# Patient Record
Sex: Male | Born: 1970 | Race: White | Hispanic: No | Marital: Married | Smoking: Former smoker
Health system: Southern US, Community
[De-identification: ages and names within clinical notes are randomized; demographics above are authoritative.]

## PROBLEM LIST (undated history)

## (undated) DIAGNOSIS — H9042 Sensorineural hearing loss, unilateral, left ear, with unrestricted hearing on the contralateral side: Secondary | ICD-10-CM

## (undated) DIAGNOSIS — F909 Attention-deficit hyperactivity disorder, unspecified type: Secondary | ICD-10-CM

## (undated) DIAGNOSIS — J309 Allergic rhinitis, unspecified: Secondary | ICD-10-CM

## (undated) DIAGNOSIS — I10 Essential (primary) hypertension: Secondary | ICD-10-CM

## (undated) DIAGNOSIS — E781 Pure hyperglyceridemia: Secondary | ICD-10-CM

## (undated) DIAGNOSIS — E7800 Pure hypercholesterolemia, unspecified: Secondary | ICD-10-CM

## (undated) DIAGNOSIS — R7303 Prediabetes: Secondary | ICD-10-CM

## (undated) DIAGNOSIS — G4733 Obstructive sleep apnea (adult) (pediatric): Secondary | ICD-10-CM

## (undated) DIAGNOSIS — G4730 Sleep apnea, unspecified: Secondary | ICD-10-CM

## (undated) DIAGNOSIS — K589 Irritable bowel syndrome without diarrhea: Secondary | ICD-10-CM

## (undated) DIAGNOSIS — E785 Hyperlipidemia, unspecified: Secondary | ICD-10-CM

## (undated) DIAGNOSIS — E119 Type 2 diabetes mellitus without complications: Secondary | ICD-10-CM

## (undated) DIAGNOSIS — R7689 Other specified abnormal immunological findings in serum: Secondary | ICD-10-CM

## (undated) DIAGNOSIS — K219 Gastro-esophageal reflux disease without esophagitis: Secondary | ICD-10-CM

## (undated) DIAGNOSIS — E663 Overweight: Secondary | ICD-10-CM

## (undated) HISTORY — PX: COLONOSCOPY: SHX174

## (undated) HISTORY — DX: Obstructive sleep apnea (adult) (pediatric): G47.33

## (undated) HISTORY — DX: Essential (primary) hypertension: I10

## (undated) HISTORY — DX: Type 2 diabetes mellitus without complications: E11.9

## (undated) HISTORY — DX: Hyperlipidemia, unspecified: E78.5

## (undated) HISTORY — PX: TYMPANOSTOMY TUBE PLACEMENT: SHX32

## (undated) HISTORY — DX: Pure hypercholesterolemia, unspecified: E78.00

## (undated) HISTORY — DX: Pure hyperglyceridemia: E78.1

## (undated) HISTORY — DX: Allergic rhinitis, unspecified: J30.9

## (undated) HISTORY — DX: Overweight: E66.3

## (undated) HISTORY — DX: Gastro-esophageal reflux disease without esophagitis: K21.9

## (undated) HISTORY — DX: Sensorineural hearing loss, unilateral, left ear, with unrestricted hearing on the contralateral side: H90.42

## (undated) HISTORY — PX: OTHER SURGICAL HISTORY: SHX169

## (undated) HISTORY — DX: Sleep apnea, unspecified: G47.30

## (undated) HISTORY — DX: Other specified abnormal immunological findings in serum: R76.89

## (undated) HISTORY — DX: Irritable bowel syndrome, unspecified: K58.9

## (undated) HISTORY — DX: Prediabetes: R73.03

## (undated) HISTORY — DX: Attention-deficit hyperactivity disorder, unspecified type: F90.9

---

## 2009-10-11 ENCOUNTER — Ambulatory Visit: Payer: Self-pay | Admitting: Cardiovascular Disease

## 2009-10-11 ENCOUNTER — Emergency Department (HOSPITAL_COMMUNITY): Admission: EM | Admit: 2009-10-11 | Discharge: 2009-10-11 | Payer: Self-pay | Admitting: Family Medicine

## 2009-10-11 ENCOUNTER — Observation Stay (HOSPITAL_COMMUNITY): Admission: EM | Admit: 2009-10-11 | Discharge: 2009-10-12 | Payer: Self-pay | Admitting: Emergency Medicine

## 2011-03-14 LAB — PROTIME-INR
INR: 0.99 (ref 0.00–1.49)
Prothrombin Time: 13 seconds (ref 11.6–15.2)

## 2011-03-14 LAB — POCT I-STAT, CHEM 8
Creatinine, Ser: 0.7 mg/dL (ref 0.4–1.5)
HCT: 43 % (ref 39.0–52.0)
Hemoglobin: 14.6 g/dL (ref 13.0–17.0)
Sodium: 139 mEq/L (ref 135–145)
TCO2: 28 mmol/L (ref 0–100)

## 2011-03-14 LAB — DIFFERENTIAL
Basophils Absolute: 0 10*3/uL (ref 0.0–0.1)
Eosinophils Relative: 2 % (ref 0–5)
Lymphocytes Relative: 31 % (ref 12–46)
Neutro Abs: 5 10*3/uL (ref 1.7–7.7)
Neutrophils Relative %: 61 % (ref 43–77)

## 2011-03-14 LAB — CBC
Platelets: 278 10*3/uL (ref 150–400)
RDW: 12.9 % (ref 11.5–15.5)
WBC: 8.3 10*3/uL (ref 4.0–10.5)

## 2011-03-14 LAB — CK TOTAL AND CKMB (NOT AT ARMC)
CK, MB: 0.8 ng/mL (ref 0.3–4.0)
Relative Index: INVALID (ref 0.0–2.5)
Total CK: 61 U/L (ref 7–232)
Total CK: 68 U/L (ref 7–232)

## 2011-03-14 LAB — APTT: aPTT: 29 seconds (ref 24–37)

## 2011-03-14 LAB — POCT CARDIAC MARKERS: Myoglobin, poc: 82.1 ng/mL (ref 12–200)

## 2014-10-25 ENCOUNTER — Encounter: Payer: Self-pay | Admitting: *Deleted

## 2014-10-26 ENCOUNTER — Ambulatory Visit (INDEPENDENT_AMBULATORY_CARE_PROVIDER_SITE_OTHER): Payer: BC Managed Care – PPO | Admitting: Neurology

## 2014-10-26 ENCOUNTER — Encounter: Payer: Self-pay | Admitting: Neurology

## 2014-10-26 VITALS — BP 144/78 | HR 76 | Temp 98.2°F | Resp 16 | Ht 69.0 in | Wt 221.5 lb

## 2014-10-26 DIAGNOSIS — M791 Myalgia: Secondary | ICD-10-CM

## 2014-10-26 DIAGNOSIS — M7918 Myalgia, other site: Secondary | ICD-10-CM

## 2014-10-26 DIAGNOSIS — R253 Fasciculation: Secondary | ICD-10-CM

## 2014-10-26 NOTE — Progress Notes (Signed)
[facility] Clinic Note - Initial Visit   Date: 10/26/2014  Gus PumaMarc Scarpulla MRN: [id number] DOB: 1971-04-26   Dear Dr.Pang:  Thank you for your kind referral of Gus PumaMarc Kronk for consultation of muscle twitches. Although his history is well known to you, please allow us to reiterate it for the purpose of our medical record. The patient was accompanied to the clinic by self.    History of Present Illness: Gus PumaMarc Red is a 43 y.o. right-handed Caucasian male with history of GERD, irritable bowel syndrome, and OSA on CPAP presenting for evaluation of muscle twitches.    Around Labor Day weekend 2015, he started developing numbness involving the left cheek and muscle twitching involving his lower legs, thighs, buttocks, rib cage, eyebrow, and lips.  It occurs daily and reports some mild improvement.  No associated pain, cramps, or weakness. He reports to have infrequent spells of "chills" over the left scalp and lower leg. Denies any falls, difficulty swallowing, or talking.  He tried using epson salt which intermittently helps.  He also complains of achy abodminal, low back, and groin pain.  He has been taking NSAIDs which helps.    He endorses stress due to taking a graduate course and three weeks later, he withdrew from the class.    No family history of muscle twitches.   Out-side paper records, electronic medical record, and images have been reviewed where available and summarized as:  Labs:  Mg 2.2, Calcium neg, CMP neg, TSH 0.7, CK 60, celiac panel neg, K 4.1 - within normal limits   Past Medical History  Diagnosis Date  . Esophageal reflux   . Allergic rhinitis   . High triglycerides   . Pure hypercholesterolemia   . IBS (irritable bowel syndrome)   . Sleep apnea   . Overweight     Past Surgical History  Procedure Laterality Date  . None       Medications:  Current Outpatient Prescriptions on File Prior to Visit  Medication Sig Dispense Refill  .  DIGESTIVE ENZYMES PO Take by mouth.    . Magnesium 300 MG CAPS Take by mouth daily.    . Multiple Vitamins-Minerals (MULTIVITAMIN PO) Take by mouth.     No current facility-administered medications on file prior to visit.    Allergies:  Allergies  Allergen Reactions  . Tetracyclines & Related     Family History: Family History  Problem Relation Age of Onset  . Lymphoma Father     Living, 2070  . Diabetes Father   . Hypertension Father   . Stroke Father   . Healthy Mother     Living, 4667  . Healthy Brother   . Healthy Son     Social History: History   Social History  . Marital Status: Married    Spouse Name: N/A    Number of Children: N/A  . Years of Education: N/A   Occupational History  . Not on file.   Social History Main Topics  . Smoking status: Former Games developermoker  . Smokeless tobacco: Never Used     Comment: Quit 18+ years  . Alcohol Use: 1.8 oz/week    3 Not specified per week     Comment: social wine, 1 glass about 3-4 times per week  . Drug Use: No     Comment: Previously meth, LSD, marijuana   . Sexual Activity:    Partners: Female   Other Topics Concern  . 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   He lives with wife and son at home.    Review of Systems:  CONSTITUTIONAL: No fevers, chills, night sweats, or weight loss.   EYES: No visual changes or eye pain ENT: No hearing changes.  No history of nose bleeds.   RESPIRATORY: No cough, wheezing and shortness of breath.   CARDIOVASCULAR: Negative for chest pain, and palpitations.   GI: Negative for abdominal discomfort, blood in stools or black stools.  No recent change in bowel habits.   GU:  No history of incontinence.   MUSCLOSKELETAL: No history of joint pain or swelling.  No myalgias.   SKIN: Negative for lesions, rash, and itching.   HEMATOLOGY/ONCOLOGY: Negative for prolonged bleeding, bruising easily, and swollen  nodes.  No history of cancer.   ENDOCRINE: Negative for cold or heat intolerance, polydipsia or goiter.   PSYCH:  No depression or anxiety symptoms.   NEURO: As Above.   Vital Signs:  BP 144/78 mmHg  Pulse 76  Temp(Src) 98.2 F (36.8 C)  Resp 16  Ht 5\' 9"  (1.753 m)  Wt 221 lb 8 oz (100.472 kg)  BMI 32.69 kg/m2   General Medical Exam:   General:  Well appearing, comfortable.   Eyes/ENT: see cranial nerve examination.   Neck: No masses appreciated.  Full range of motion without tenderness.   Respiratory:  Clear to auscultation, good air entry bilaterally.   Cardiac:  Regular rate and rhythm, no murmur.   Extremities:  No deformities, edema, or skin discoloration. Good capillary refill.   Skin:  Skin color, texture, turgor normal. No rashes or lesions.  Neurological Exam: MENTAL STATUS including orientation to time, place, person, recent and remote memory, attention span and concentration, language, and fund of knowledge is normal.  Speech is not dysarthric.  CRANIAL NERVES: II:  No visual field defects.  Unremarkable fundi.   III-IV-VI: Pupils equal round and reactive to light.  Normal conjugate, extra-ocular eye movements in all directions of gaze.  No nystagmus.  No ptosis.   V:  Normal facial sensation.     VII:  Normal facial symmetry and movements.  No pathologic facial reflexes.  VIII:  Normal hearing and vestibular function.   IX-X:  Normal palatal movement.   XI:  Normal shoulder shrug and head rotation.   XII:  Normal tongue strength and range of motion, no deviation or fasciculation.  MOTOR:  Rare fasciculation noted of the left abductor hallucis.  No atrophy.  No pronator drift.  Tone is normal.    Right Upper Extremity:    Left Upper Extremity:    Deltoid  5/5   Deltoid  5/5   Biceps  5/5   Biceps  5/5   Triceps  5/5   Triceps  5/5   Wrist extensors  5/5   Wrist extensors  5/5   Wrist flexors  5/5   Wrist flexors  5/5   Finger extensors  5/5   Finger extensors   5/5   Finger flexors  5/5   Finger flexors  5/5   Dorsal interossei  5/5   Dorsal interossei  5/5   Abductor pollicis  5/5   Abductor pollicis  5/5   Tone (Ashworth scale)  0  Tone (Ashworth scale)  0   Right Lower Extremity:    Left Lower Extremity:    Hip flexors  5/5   Hip flexors  5/5   Hip extensors  5/5   Hip extensors  5/5   Knee flexors  5/5   Knee flexors  5/5   Knee extensors  5/5   Knee extensors  5/5   Dorsiflexors  5/5   Dorsiflexors  5/5   Plantarflexors  5/5   Plantarflexors  5/5   Toe extensors  5/5   Toe extensors  5/5   Toe flexors  5/5   Toe flexors  5/5   Tone (Ashworth scale)  0  Tone (Ashworth scale)  0   MSRs:  Right                                                                 Left brachioradialis 2+  brachioradialis 2+  biceps 2+  biceps 2+  triceps 2+  triceps 2+  patellar 2+  patellar 2+  ankle jerk 2+  ankle jerk 2+  Hoffman no  Hoffman no  plantar response down  plantar response down   SENSORY:  Normal and symmetric perception of light touch, pinprick, vibration, and proprioception.  Romberg's sign absent.   COORDINATION/GAIT: Normal finger-to- nose-finger and heel-to-shin.  Intact rapid alternating movements bilaterally.  Able to rise from a chair without using arms.  Gait narrow based and stable. Tandem and stressed gait intact.    IMPRESSION: Mr. Monica BectonDerro is a 43 year-old gentleman presenting for evaluation of painless muscle twitches.  There were rare fasciculations seen involving the right abductor hallucis muscle.  No associated motor weakness, abnormal reflexes, or sensory deficits.  I reassured the patient that he most likely has benign fasciculations.  Electrolytes, thyroid hormone, and CK is within normal limits.  I offered the patient electrodiagnostic testing (left arm and leg) to exclude other worrisome neurodegenerative diseases, which he would like to pursue.  Results of this testing will be communicate to patient via telephone.    Regarding his generalized abdominal wall and groin pain, this may be more musculoskeletal and he may continue NSAIDs since this is providing relief.  Return to clinic as needed.   The duration of this appointment visit was 45 minutes of face-to-face time with the patient.  Greater than 50% of this time was spent in counseling, explanation of diagnosis, planning of further management, and coordination of care.   Thank you for allowing me to participate in patient's care.  If I can answer any additional questions, I would be pleased to do so.    Sincerely,    Porshia Blizzard K. Allena KatzPatel, DO

## 2014-10-26 NOTE — Patient Instructions (Addendum)
1.  EMG of the left arm and leg 2.  Telephone update with results

## 2014-10-26 NOTE — Progress Notes (Signed)
Note faxed.

## 2014-11-01 ENCOUNTER — Encounter: Payer: BC Managed Care – PPO | Admitting: Neurology

## 2014-11-11 ENCOUNTER — Encounter: Payer: Self-pay | Admitting: Gastroenterology

## 2014-11-15 ENCOUNTER — Encounter: Payer: Self-pay | Admitting: Gastroenterology

## 2014-11-15 ENCOUNTER — Ambulatory Visit (INDEPENDENT_AMBULATORY_CARE_PROVIDER_SITE_OTHER): Payer: BC Managed Care – PPO | Admitting: Gastroenterology

## 2014-11-15 VITALS — BP 136/88 | HR 84 | Ht 68.5 in | Wt 219.2 lb

## 2014-11-15 DIAGNOSIS — R194 Change in bowel habit: Secondary | ICD-10-CM

## 2014-11-15 DIAGNOSIS — R198 Other specified symptoms and signs involving the digestive system and abdomen: Secondary | ICD-10-CM

## 2014-11-15 MED ORDER — MOVIPREP 100 G PO SOLR: 1.0000 | Freq: Once | ORAL | Status: DC

## 2014-11-15 NOTE — Patient Instructions (Signed)
You will be set up for a colonoscopy.

## 2014-11-15 NOTE — Progress Notes (Signed)
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  Pains in abdomen for about a year.  Back pains, low back, also low abdomen.  Has been to chriopracters, wonders if its from too much sitting.  Pain along left abd, also pain in lower quadrants bilaterally.    Moving his bowels sometimes help.  Since September or so, yellowish appearing stools.  Can have thinner stools at times.  Overall weight up 20 pounds in sept to October this year.  Has had minor rectal bleeding at times, rarely.  Doesn't have to push, strain to move his bowels very often.  Tends to have looser stools.  Takes magnesium about every day.  May relate to his bowels.  Was taking dones a month ago, also advil at times.   Review of systems: Pertinent positive and negative review of systems were noted in the above HPI section. Complete review of systems was performed and was otherwise normal.    Past Medical History  Diagnosis Date  . Esophageal reflux   . Allergic rhinitis   . High triglycerides   . Pure hypercholesterolemia   . IBS (irritable bowel syndrome)   . Sleep apnea   . Overweight     Past Surgical History  Procedure Laterality Date  . None      Current Outpatient Prescriptions  Medication Sig Dispense Refill  . DIGESTIVE ENZYMES PO Take by mouth.    . Magnesium 300 MG CAPS Take by mouth daily.    . Melatonin 3 MG CAPS Take 1 capsule by mouth at bedtime.    . Multiple Vitamins-Minerals (MULTIVITAMIN PO) Take by mouth.    . Omega-3 Fatty Acids (FISH OIL BURP-LESS) 1000 MG CAPS Take 1 capsule by mouth daily.     No current facility-administered medications for this visit.    Allergies as of 11/15/2014 - Review Complete 11/15/2014  Allergen Reaction Noted  . Tetracyclines & related  10/25/2014    Family History  Problem Relation Age of Onset  . Lymphoma Father     Living, 7670  . Diabetes Father   . Hypertension Father   .  Stroke Father   . Healthy Mother     Living, 3867  . Healthy Brother   . Healthy Son     History   Social History  . Marital Status: Married    Spouse Name: Maralyn SagoSarah    Number of Children: 1  . Years of Education: Bachelor   Occupational History  . TV Producer     [employer]   Social History Main Topics  . Smoking status: Former Games developermoker  . Smokeless tobacco: Never Used     Comment: Quit 18+ years  . Alcohol Use: 1.8 oz/week    3 Not specified per week     Comment: social wine, 1 glass about 3-4 times per week  . Drug Use: No     Comment: Previously meth, LSD, marijuana   . Sexual Activity:    Partners: Female   Other Topics Concern  . 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   He lives with wife and son at home.       Physical Exam: BP 136/88 mmHg  Pulse 84  Ht 5' 8.5" (1.74 m)  Wt 219 lb 4 oz (99.451 kg)  BMI 32.85 kg/m2  Constitutional: generally well-appearing Psychiatric: alert and oriented x3 Eyes: extraocular movements intact Mouth: oral pharynx moist, no lesions Neck: supple no lymphadenopathy Cardiovascular: heart regular rate and rhythm Lungs: clear to auscultation bilaterally Abdomen: soft, nontender, nondistended, no obvious ascites, no peritoneal signs, normal bowel sounds Extremities: no lower extremity edema bilaterally Skin: no lesions on visible extremities    Assessment and plan: 43 y.o. male with  change in bowels, lower abdominal discomforts  I suspect this is IBS related however his age I would like to proceed with colonoscopy at his soonest convenience to rule out structural causes, neoplasm. He also takes magnesium on a daily basis hoping that will help his back pain. He does understand that this can affect his bowels and has actually noticed a slight impact in his bowels. It shouldn't cause thinner caliber stools or the lower abdominal pains that he's been having  however.

## 2014-11-25 ENCOUNTER — Encounter: Payer: Self-pay | Admitting: Gastroenterology

## 2014-12-21 ENCOUNTER — Telehealth: Payer: Self-pay | Admitting: Neurology

## 2014-12-21 NOTE — Telephone Encounter (Signed)
Pt canceled his 90mins EMG for Monday 12/27/14. Pt did not give a reason

## 2014-12-27 ENCOUNTER — Encounter: Payer: BC Managed Care – PPO | Admitting: Neurology

## 2015-01-21 ENCOUNTER — Encounter: Payer: BC Managed Care – PPO | Admitting: Gastroenterology

## 2016-06-27 DIAGNOSIS — Z13220 Encounter for screening for lipoid disorders: Secondary | ICD-10-CM | POA: Diagnosis not present

## 2016-06-27 DIAGNOSIS — Z0000 Encounter for general adult medical examination without abnormal findings: Secondary | ICD-10-CM | POA: Diagnosis not present

## 2016-06-27 DIAGNOSIS — Z125 Encounter for screening for malignant neoplasm of prostate: Secondary | ICD-10-CM | POA: Diagnosis not present

## 2016-07-05 DIAGNOSIS — E6609 Other obesity due to excess calories: Secondary | ICD-10-CM | POA: Diagnosis not present

## 2016-07-05 DIAGNOSIS — R7303 Prediabetes: Secondary | ICD-10-CM | POA: Diagnosis not present

## 2016-07-05 DIAGNOSIS — Z6831 Body mass index (BMI) 31.0-31.9, adult: Secondary | ICD-10-CM | POA: Diagnosis not present

## 2016-07-05 DIAGNOSIS — Z0000 Encounter for general adult medical examination without abnormal findings: Secondary | ICD-10-CM | POA: Diagnosis not present

## 2016-07-13 DIAGNOSIS — G4733 Obstructive sleep apnea (adult) (pediatric): Secondary | ICD-10-CM | POA: Diagnosis not present

## 2016-08-13 DIAGNOSIS — G4733 Obstructive sleep apnea (adult) (pediatric): Secondary | ICD-10-CM | POA: Diagnosis not present

## 2016-09-12 DIAGNOSIS — G4733 Obstructive sleep apnea (adult) (pediatric): Secondary | ICD-10-CM | POA: Diagnosis not present

## 2016-09-13 DIAGNOSIS — Z23 Encounter for immunization: Secondary | ICD-10-CM | POA: Diagnosis not present

## 2016-10-13 DIAGNOSIS — G4733 Obstructive sleep apnea (adult) (pediatric): Secondary | ICD-10-CM | POA: Diagnosis not present

## 2016-11-09 DIAGNOSIS — M25511 Pain in right shoulder: Secondary | ICD-10-CM | POA: Diagnosis not present

## 2016-11-09 DIAGNOSIS — M25512 Pain in left shoulder: Secondary | ICD-10-CM | POA: Diagnosis not present

## 2016-11-09 DIAGNOSIS — R1031 Right lower quadrant pain: Secondary | ICD-10-CM | POA: Diagnosis not present

## 2016-11-09 DIAGNOSIS — M545 Low back pain: Secondary | ICD-10-CM | POA: Diagnosis not present

## 2016-11-13 DIAGNOSIS — M545 Low back pain: Secondary | ICD-10-CM | POA: Diagnosis not present

## 2016-11-13 DIAGNOSIS — M25512 Pain in left shoulder: Secondary | ICD-10-CM | POA: Diagnosis not present

## 2016-11-13 DIAGNOSIS — R1031 Right lower quadrant pain: Secondary | ICD-10-CM | POA: Diagnosis not present

## 2016-11-13 DIAGNOSIS — M25511 Pain in right shoulder: Secondary | ICD-10-CM | POA: Diagnosis not present

## 2016-11-20 DIAGNOSIS — M545 Low back pain: Secondary | ICD-10-CM | POA: Diagnosis not present

## 2016-11-20 DIAGNOSIS — M25511 Pain in right shoulder: Secondary | ICD-10-CM | POA: Diagnosis not present

## 2016-11-20 DIAGNOSIS — M25512 Pain in left shoulder: Secondary | ICD-10-CM | POA: Diagnosis not present

## 2016-11-20 DIAGNOSIS — R1031 Right lower quadrant pain: Secondary | ICD-10-CM | POA: Diagnosis not present

## 2016-11-23 DIAGNOSIS — M25512 Pain in left shoulder: Secondary | ICD-10-CM | POA: Diagnosis not present

## 2016-11-23 DIAGNOSIS — M545 Low back pain: Secondary | ICD-10-CM | POA: Diagnosis not present

## 2016-11-23 DIAGNOSIS — M25511 Pain in right shoulder: Secondary | ICD-10-CM | POA: Diagnosis not present

## 2016-11-23 DIAGNOSIS — R1031 Right lower quadrant pain: Secondary | ICD-10-CM | POA: Diagnosis not present

## 2016-11-27 DIAGNOSIS — R1031 Right lower quadrant pain: Secondary | ICD-10-CM | POA: Diagnosis not present

## 2016-11-27 DIAGNOSIS — M25512 Pain in left shoulder: Secondary | ICD-10-CM | POA: Diagnosis not present

## 2016-11-27 DIAGNOSIS — M545 Low back pain: Secondary | ICD-10-CM | POA: Diagnosis not present

## 2016-11-27 DIAGNOSIS — M25511 Pain in right shoulder: Secondary | ICD-10-CM | POA: Diagnosis not present

## 2016-11-29 DIAGNOSIS — M25511 Pain in right shoulder: Secondary | ICD-10-CM | POA: Diagnosis not present

## 2016-11-29 DIAGNOSIS — M25512 Pain in left shoulder: Secondary | ICD-10-CM | POA: Diagnosis not present

## 2016-11-29 DIAGNOSIS — M545 Low back pain: Secondary | ICD-10-CM | POA: Diagnosis not present

## 2016-11-29 DIAGNOSIS — R1031 Right lower quadrant pain: Secondary | ICD-10-CM | POA: Diagnosis not present

## 2016-12-11 DIAGNOSIS — M25511 Pain in right shoulder: Secondary | ICD-10-CM | POA: Diagnosis not present

## 2016-12-11 DIAGNOSIS — M545 Low back pain: Secondary | ICD-10-CM | POA: Diagnosis not present

## 2016-12-11 DIAGNOSIS — M25512 Pain in left shoulder: Secondary | ICD-10-CM | POA: Diagnosis not present

## 2016-12-11 DIAGNOSIS — R1031 Right lower quadrant pain: Secondary | ICD-10-CM | POA: Diagnosis not present

## 2016-12-13 DIAGNOSIS — M25511 Pain in right shoulder: Secondary | ICD-10-CM | POA: Diagnosis not present

## 2016-12-13 DIAGNOSIS — R1031 Right lower quadrant pain: Secondary | ICD-10-CM | POA: Diagnosis not present

## 2016-12-13 DIAGNOSIS — M545 Low back pain: Secondary | ICD-10-CM | POA: Diagnosis not present

## 2016-12-13 DIAGNOSIS — M25512 Pain in left shoulder: Secondary | ICD-10-CM | POA: Diagnosis not present

## 2016-12-18 DIAGNOSIS — R1031 Right lower quadrant pain: Secondary | ICD-10-CM | POA: Diagnosis not present

## 2016-12-18 DIAGNOSIS — M545 Low back pain: Secondary | ICD-10-CM | POA: Diagnosis not present

## 2016-12-18 DIAGNOSIS — M25511 Pain in right shoulder: Secondary | ICD-10-CM | POA: Diagnosis not present

## 2016-12-18 DIAGNOSIS — M25512 Pain in left shoulder: Secondary | ICD-10-CM | POA: Diagnosis not present

## 2016-12-19 DIAGNOSIS — K582 Mixed irritable bowel syndrome: Secondary | ICD-10-CM | POA: Diagnosis not present

## 2016-12-24 DIAGNOSIS — R1031 Right lower quadrant pain: Secondary | ICD-10-CM | POA: Diagnosis not present

## 2016-12-24 DIAGNOSIS — M25512 Pain in left shoulder: Secondary | ICD-10-CM | POA: Diagnosis not present

## 2016-12-24 DIAGNOSIS — M25511 Pain in right shoulder: Secondary | ICD-10-CM | POA: Diagnosis not present

## 2016-12-24 DIAGNOSIS — M545 Low back pain: Secondary | ICD-10-CM | POA: Diagnosis not present

## 2016-12-31 DIAGNOSIS — M545 Low back pain: Secondary | ICD-10-CM | POA: Diagnosis not present

## 2016-12-31 DIAGNOSIS — M25512 Pain in left shoulder: Secondary | ICD-10-CM | POA: Diagnosis not present

## 2016-12-31 DIAGNOSIS — M25511 Pain in right shoulder: Secondary | ICD-10-CM | POA: Diagnosis not present

## 2016-12-31 DIAGNOSIS — R1031 Right lower quadrant pain: Secondary | ICD-10-CM | POA: Diagnosis not present

## 2017-01-02 DIAGNOSIS — M25512 Pain in left shoulder: Secondary | ICD-10-CM | POA: Diagnosis not present

## 2017-01-02 DIAGNOSIS — R1031 Right lower quadrant pain: Secondary | ICD-10-CM | POA: Diagnosis not present

## 2017-01-02 DIAGNOSIS — M545 Low back pain: Secondary | ICD-10-CM | POA: Diagnosis not present

## 2017-01-02 DIAGNOSIS — M25511 Pain in right shoulder: Secondary | ICD-10-CM | POA: Diagnosis not present

## 2017-01-07 DIAGNOSIS — M545 Low back pain: Secondary | ICD-10-CM | POA: Diagnosis not present

## 2017-01-07 DIAGNOSIS — M25512 Pain in left shoulder: Secondary | ICD-10-CM | POA: Diagnosis not present

## 2017-01-07 DIAGNOSIS — R1031 Right lower quadrant pain: Secondary | ICD-10-CM | POA: Diagnosis not present

## 2017-01-07 DIAGNOSIS — M25511 Pain in right shoulder: Secondary | ICD-10-CM | POA: Diagnosis not present

## 2017-01-09 DIAGNOSIS — M25511 Pain in right shoulder: Secondary | ICD-10-CM | POA: Diagnosis not present

## 2017-01-09 DIAGNOSIS — R1031 Right lower quadrant pain: Secondary | ICD-10-CM | POA: Diagnosis not present

## 2017-01-09 DIAGNOSIS — M25512 Pain in left shoulder: Secondary | ICD-10-CM | POA: Diagnosis not present

## 2017-01-09 DIAGNOSIS — M545 Low back pain: Secondary | ICD-10-CM | POA: Diagnosis not present

## 2017-01-14 DIAGNOSIS — R1031 Right lower quadrant pain: Secondary | ICD-10-CM | POA: Diagnosis not present

## 2017-01-14 DIAGNOSIS — M25511 Pain in right shoulder: Secondary | ICD-10-CM | POA: Diagnosis not present

## 2017-01-14 DIAGNOSIS — M545 Low back pain: Secondary | ICD-10-CM | POA: Diagnosis not present

## 2017-01-14 DIAGNOSIS — M25512 Pain in left shoulder: Secondary | ICD-10-CM | POA: Diagnosis not present

## 2017-01-21 DIAGNOSIS — M545 Low back pain: Secondary | ICD-10-CM | POA: Diagnosis not present

## 2017-01-21 DIAGNOSIS — R1031 Right lower quadrant pain: Secondary | ICD-10-CM | POA: Diagnosis not present

## 2017-01-21 DIAGNOSIS — M25511 Pain in right shoulder: Secondary | ICD-10-CM | POA: Diagnosis not present

## 2017-01-21 DIAGNOSIS — M25512 Pain in left shoulder: Secondary | ICD-10-CM | POA: Diagnosis not present

## 2017-01-23 DIAGNOSIS — M545 Low back pain: Secondary | ICD-10-CM | POA: Diagnosis not present

## 2017-01-23 DIAGNOSIS — M25512 Pain in left shoulder: Secondary | ICD-10-CM | POA: Diagnosis not present

## 2017-01-23 DIAGNOSIS — M25511 Pain in right shoulder: Secondary | ICD-10-CM | POA: Diagnosis not present

## 2017-01-23 DIAGNOSIS — R1031 Right lower quadrant pain: Secondary | ICD-10-CM | POA: Diagnosis not present

## 2017-01-28 DIAGNOSIS — M25512 Pain in left shoulder: Secondary | ICD-10-CM | POA: Diagnosis not present

## 2017-01-28 DIAGNOSIS — M545 Low back pain: Secondary | ICD-10-CM | POA: Diagnosis not present

## 2017-01-28 DIAGNOSIS — R1031 Right lower quadrant pain: Secondary | ICD-10-CM | POA: Diagnosis not present

## 2017-01-28 DIAGNOSIS — M25511 Pain in right shoulder: Secondary | ICD-10-CM | POA: Diagnosis not present

## 2017-01-30 DIAGNOSIS — R1031 Right lower quadrant pain: Secondary | ICD-10-CM | POA: Diagnosis not present

## 2017-01-30 DIAGNOSIS — R1032 Left lower quadrant pain: Secondary | ICD-10-CM | POA: Diagnosis not present

## 2017-01-30 DIAGNOSIS — M25512 Pain in left shoulder: Secondary | ICD-10-CM | POA: Diagnosis not present

## 2017-01-30 DIAGNOSIS — M545 Low back pain: Secondary | ICD-10-CM | POA: Diagnosis not present

## 2017-01-30 DIAGNOSIS — M25511 Pain in right shoulder: Secondary | ICD-10-CM | POA: Diagnosis not present

## 2017-02-04 DIAGNOSIS — R1031 Right lower quadrant pain: Secondary | ICD-10-CM | POA: Diagnosis not present

## 2017-02-04 DIAGNOSIS — M545 Low back pain: Secondary | ICD-10-CM | POA: Diagnosis not present

## 2017-02-04 DIAGNOSIS — M25511 Pain in right shoulder: Secondary | ICD-10-CM | POA: Diagnosis not present

## 2017-02-04 DIAGNOSIS — M25512 Pain in left shoulder: Secondary | ICD-10-CM | POA: Diagnosis not present

## 2017-02-06 DIAGNOSIS — M25512 Pain in left shoulder: Secondary | ICD-10-CM | POA: Diagnosis not present

## 2017-02-06 DIAGNOSIS — M25511 Pain in right shoulder: Secondary | ICD-10-CM | POA: Diagnosis not present

## 2017-02-06 DIAGNOSIS — R1031 Right lower quadrant pain: Secondary | ICD-10-CM | POA: Diagnosis not present

## 2017-02-06 DIAGNOSIS — M545 Low back pain: Secondary | ICD-10-CM | POA: Diagnosis not present

## 2017-02-20 DIAGNOSIS — K582 Mixed irritable bowel syndrome: Secondary | ICD-10-CM | POA: Diagnosis not present

## 2017-02-21 DIAGNOSIS — M25511 Pain in right shoulder: Secondary | ICD-10-CM | POA: Diagnosis not present

## 2017-02-21 DIAGNOSIS — M545 Low back pain: Secondary | ICD-10-CM | POA: Diagnosis not present

## 2017-02-21 DIAGNOSIS — M25512 Pain in left shoulder: Secondary | ICD-10-CM | POA: Diagnosis not present

## 2017-02-21 DIAGNOSIS — R1031 Right lower quadrant pain: Secondary | ICD-10-CM | POA: Diagnosis not present

## 2017-03-06 ENCOUNTER — Other Ambulatory Visit (INDEPENDENT_AMBULATORY_CARE_PROVIDER_SITE_OTHER): Payer: BLUE CROSS/BLUE SHIELD

## 2017-03-06 ENCOUNTER — Encounter: Payer: Self-pay | Admitting: Gastroenterology

## 2017-03-06 ENCOUNTER — Encounter (INDEPENDENT_AMBULATORY_CARE_PROVIDER_SITE_OTHER): Payer: Self-pay

## 2017-03-06 ENCOUNTER — Ambulatory Visit (INDEPENDENT_AMBULATORY_CARE_PROVIDER_SITE_OTHER): Payer: BLUE CROSS/BLUE SHIELD | Admitting: Gastroenterology

## 2017-03-06 VITALS — BP 160/90 | HR 88 | Ht 68.0 in | Wt 243.5 lb

## 2017-03-06 DIAGNOSIS — R1013 Epigastric pain: Secondary | ICD-10-CM | POA: Diagnosis not present

## 2017-03-06 DIAGNOSIS — G8929 Other chronic pain: Secondary | ICD-10-CM | POA: Diagnosis not present

## 2017-03-06 DIAGNOSIS — M545 Low back pain: Secondary | ICD-10-CM | POA: Diagnosis not present

## 2017-03-06 DIAGNOSIS — R1031 Right lower quadrant pain: Secondary | ICD-10-CM

## 2017-03-06 DIAGNOSIS — R635 Abnormal weight gain: Secondary | ICD-10-CM | POA: Diagnosis not present

## 2017-03-06 DIAGNOSIS — M25511 Pain in right shoulder: Secondary | ICD-10-CM | POA: Diagnosis not present

## 2017-03-06 DIAGNOSIS — M25512 Pain in left shoulder: Secondary | ICD-10-CM | POA: Diagnosis not present

## 2017-03-06 LAB — COMPREHENSIVE METABOLIC PANEL
ALK PHOS: 61 U/L (ref 39–117)
ALT: 25 U/L (ref 0–53)
AST: 16 U/L (ref 0–37)
Albumin: 4.7 g/dL (ref 3.5–5.2)
BILIRUBIN TOTAL: 0.3 mg/dL (ref 0.2–1.2)
BUN: 14 mg/dL (ref 6–23)
CALCIUM: 9.6 mg/dL (ref 8.4–10.5)
CO2: 31 mEq/L (ref 19–32)
Chloride: 101 mEq/L (ref 96–112)
Creatinine, Ser: 0.94 mg/dL (ref 0.40–1.50)
GFR: 91.23 mL/min (ref >60.00)
GLUCOSE: 101 mg/dL — AB (ref 70–99)
POTASSIUM: 4.5 meq/L (ref 3.5–5.1)
Sodium: 138 mEq/L (ref 135–145)
TOTAL PROTEIN: 7.4 g/dL (ref 6.0–8.3)

## 2017-03-06 LAB — CBC WITH DIFFERENTIAL/PLATELET
Basophils Absolute: 0.1 10*3/uL (ref 0.0–0.1)
Basophils Relative: 0.9 % (ref 0.0–3.0)
EOS PCT: 1.6 % (ref 0.0–5.0)
Eosinophils Absolute: 0.1 10*3/uL (ref 0.0–0.7)
HEMATOCRIT: 44 % (ref 39.0–52.0)
Hemoglobin: 14.9 g/dL (ref 13.0–17.0)
LYMPHS ABS: 2.6 10*3/uL (ref 0.7–4.0)
LYMPHS PCT: 28.5 % (ref 12.0–46.0)
MCHC: 33.8 g/dL (ref 30.0–36.0)
MCV: 75.4 fl — AB (ref 78.0–100.0)
MONOS PCT: 7.1 % (ref 3.0–12.0)
Monocytes Absolute: 0.6 10*3/uL (ref 0.1–1.0)
NEUTROS ABS: 5.6 10*3/uL (ref 1.4–7.7)
NEUTROS PCT: 61.9 % (ref 43.0–77.0)
PLATELETS: 302 10*3/uL (ref 150.0–400.0)
RBC: 5.84 Mil/uL — AB (ref 4.22–5.81)
RDW: 13.5 % (ref 11.5–15.5)
WBC: 9.1 10*3/uL (ref 4.0–10.5)

## 2017-03-06 LAB — IGA: IGA: 165 mg/dL (ref 68–378)

## 2017-03-06 MED ORDER — NA SULFATE-K SULFATE-MG SULF 17.5-3.13-1.6 GM/177ML PO SOLN: 1.0000 | Freq: Once | ORAL | 0 refills | Status: AC

## 2017-03-06 NOTE — Progress Notes (Signed)
HPI: This is a very pleasant 46 year old man whom I last saw about 2 and half years ago.  Chief complaint is a variety of abdominal pains, change in bowel habits    I saw him about 2 and half years ago for change in bowel movements and lower abdominal discomforts. I recommended at that time he had a colonoscopy which he scheduled and then canceled. We have not heard back from his since then.  Same pains as before.    Sits in car a a lot.  Wonders if its muscular  Can have RLQ pains, gassy in nature. Goes away with time.  Also periumb pains that imrpove with BMs.  Left flank pains, worse if he presses.   Also LLQ pains.  Bowels vary; several BMs daily, 2-3.  Usually without pushing/straining very much.  Occasionally urge to have BM without productive BM  Had food sensitivity testing recently: dairy, eggs, beef, wheat.  He stopped much of these and started feeling overall better.  Evened out his bowels.  Tested for gluten and ovoided it for a week but back on for past 10 days.  Interested in celiac testing.  Over the years, noticed certain foods will cause gassiness and chest pains. Belchign helped.    Wonders about low acid state.   He will eat and sometimes shortly afterwards he felt extreme fatigue.  Tried adding acid hcl.    ROS: complete GI ROS as described in HPI.  Constitutional:  No unintentional weight loss   Past Medical History:  Diagnosis Date  . Allergic rhinitis   . Esophageal reflux   . High triglycerides   . IBS (irritable bowel syndrome)   . Overweight   . Pure hypercholesterolemia   . Sleep apnea     Past Surgical History:  Procedure Laterality Date  . None      No current outpatient prescriptions on file.   No current facility-administered medications for this visit.     Allergies as of 03/06/2017 - Review Complete 03/06/2017  Allergen Reaction Noted  . Tetracyclines & related  10/25/2014    Family History  Problem Relation Age of  Onset  . Healthy Mother     Living, 68  . Lymphoma Father     Living, 68  . Diabetes Father   . Hypertension Father   . Stroke Father   . Healthy Brother   . Healthy Son     Social History   Social History  . Marital status: Married    Spouse name: Judson Roch  . Number of children: 1  . Years of education: Bachelor   Occupational History  . TV Producer     [employer]   Social History Main Topics  . Smoking status: Former Research scientist (life sciences)  . Smokeless tobacco: Never Used     Comment: Quit 18+ years  . Alcohol use 1.8 oz/week    3 Standard drinks or equivalent per week     Comment: social wine, 1 glass about 3-4 times per week  . Drug use: No     Comment: Previously meth, LSD, marijuana   . Sexual activity: Yes    Partners: Female   Other Topics Concern  . 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   He lives with wife and son at home.     Physical Exam: BP (!) 160/90 (BP Location: Left Arm, Patient Position: Sitting,  Cuff Size: Normal)   Pulse 88   Ht _0  (1.727 m) Comment: height measured without shoes  Wt 243 lb 8 oz (110.5 kg)   BMI 37.02 kg/m  Constitutional: generally well-appearing Psychiatric: alert and oriented x3 Abdomen: soft, nontender, nondistended, no obvious ascites, no peritoneal signs, normal bowel sounds No peripheral edema noted in lower extremities  Assessment and plan: 46 y.o. male with Myriad upper and lower GI complaints, specifically change in bowels, right lower quadrant pain, left lower quadrant pain, periumbilical pain, weight gain  I think many of these issues are dietary related, perhaps related to his increasing weight. I recommended we exclude neoplasm and significant upper GI tract pathology with both colonoscopy and upper endoscopy. He is also going to get a set of labs including CBC, complete about profile and celiac sprue testing.  Please see the "Patient Instructions"  section for addition details about the plan.  [name], MD [facility] 03/06/2017, 3:29 PM

## 2017-03-06 NOTE — Patient Instructions (Signed)
You will have labs checked today in the basement lab.  Please head down after you check out with the front desk  (cbc, cmet, celiac tTG and total IgA level). You will be set up for a colonoscopy abd pains, change in bowels. You will be set up for an upper endoscopy dyspepsia, abd pains.

## 2017-03-07 LAB — TISSUE TRANSGLUTAMINASE, IGA: TISSUE TRANSGLUTAMINASE AB, IGA: 1 U/mL (ref <4)

## 2017-03-13 DIAGNOSIS — M25512 Pain in left shoulder: Secondary | ICD-10-CM | POA: Diagnosis not present

## 2017-03-13 DIAGNOSIS — M545 Low back pain: Secondary | ICD-10-CM | POA: Diagnosis not present

## 2017-03-13 DIAGNOSIS — M25511 Pain in right shoulder: Secondary | ICD-10-CM | POA: Diagnosis not present

## 2017-03-13 DIAGNOSIS — R1031 Right lower quadrant pain: Secondary | ICD-10-CM | POA: Diagnosis not present

## 2017-03-20 DIAGNOSIS — H5211 Myopia, right eye: Secondary | ICD-10-CM | POA: Diagnosis not present

## 2017-03-20 DIAGNOSIS — M545 Low back pain: Secondary | ICD-10-CM | POA: Diagnosis not present

## 2017-03-20 DIAGNOSIS — R1031 Right lower quadrant pain: Secondary | ICD-10-CM | POA: Diagnosis not present

## 2017-03-20 DIAGNOSIS — M25511 Pain in right shoulder: Secondary | ICD-10-CM | POA: Diagnosis not present

## 2017-03-20 DIAGNOSIS — M25512 Pain in left shoulder: Secondary | ICD-10-CM | POA: Diagnosis not present

## 2017-03-27 DIAGNOSIS — R1031 Right lower quadrant pain: Secondary | ICD-10-CM | POA: Diagnosis not present

## 2017-03-27 DIAGNOSIS — M545 Low back pain: Secondary | ICD-10-CM | POA: Diagnosis not present

## 2017-03-27 DIAGNOSIS — M25511 Pain in right shoulder: Secondary | ICD-10-CM | POA: Diagnosis not present

## 2017-03-27 DIAGNOSIS — M25512 Pain in left shoulder: Secondary | ICD-10-CM | POA: Diagnosis not present

## 2017-04-03 DIAGNOSIS — R1031 Right lower quadrant pain: Secondary | ICD-10-CM | POA: Diagnosis not present

## 2017-04-03 DIAGNOSIS — M25511 Pain in right shoulder: Secondary | ICD-10-CM | POA: Diagnosis not present

## 2017-04-03 DIAGNOSIS — M25512 Pain in left shoulder: Secondary | ICD-10-CM | POA: Diagnosis not present

## 2017-04-03 DIAGNOSIS — M545 Low back pain: Secondary | ICD-10-CM | POA: Diagnosis not present

## 2017-04-10 DIAGNOSIS — M545 Low back pain: Secondary | ICD-10-CM | POA: Diagnosis not present

## 2017-04-10 DIAGNOSIS — M25511 Pain in right shoulder: Secondary | ICD-10-CM | POA: Diagnosis not present

## 2017-04-10 DIAGNOSIS — M25512 Pain in left shoulder: Secondary | ICD-10-CM | POA: Diagnosis not present

## 2017-04-10 DIAGNOSIS — R1031 Right lower quadrant pain: Secondary | ICD-10-CM | POA: Diagnosis not present

## 2017-04-17 DIAGNOSIS — M25511 Pain in right shoulder: Secondary | ICD-10-CM | POA: Diagnosis not present

## 2017-04-17 DIAGNOSIS — M25512 Pain in left shoulder: Secondary | ICD-10-CM | POA: Diagnosis not present

## 2017-04-17 DIAGNOSIS — R1031 Right lower quadrant pain: Secondary | ICD-10-CM | POA: Diagnosis not present

## 2017-04-17 DIAGNOSIS — M545 Low back pain: Secondary | ICD-10-CM | POA: Diagnosis not present

## 2017-04-29 ENCOUNTER — Encounter: Payer: Self-pay | Admitting: Gastroenterology

## 2017-05-10 ENCOUNTER — Ambulatory Visit (AMBULATORY_SURGERY_CENTER): Payer: BLUE CROSS/BLUE SHIELD | Admitting: Gastroenterology

## 2017-05-10 ENCOUNTER — Encounter: Payer: Self-pay | Admitting: Gastroenterology

## 2017-05-10 VITALS — BP 141/70 | HR 77 | Temp 98.7°F | Resp 12 | Ht 68.0 in | Wt 243.0 lb

## 2017-05-10 DIAGNOSIS — K449 Diaphragmatic hernia without obstruction or gangrene: Secondary | ICD-10-CM

## 2017-05-10 DIAGNOSIS — R1013 Epigastric pain: Secondary | ICD-10-CM | POA: Diagnosis not present

## 2017-05-10 DIAGNOSIS — R194 Change in bowel habit: Secondary | ICD-10-CM | POA: Diagnosis present

## 2017-05-10 DIAGNOSIS — R635 Abnormal weight gain: Secondary | ICD-10-CM | POA: Diagnosis not present

## 2017-05-10 DIAGNOSIS — R1032 Left lower quadrant pain: Secondary | ICD-10-CM | POA: Diagnosis not present

## 2017-05-10 MED ORDER — SODIUM CHLORIDE 0.9 % IV SOLN: 500.0000 mL | INTRAVENOUS | Status: AC

## 2017-05-10 NOTE — Op Note (Signed)
[facility] Patient Name: Zachary Herman Procedure Date: 05/10/2017 1:31 PM MRN: [id number] Endoscopist: Rachael Fee , MD Age: 46 Referring MD:  Date of Birth: 1971/10/07 Gender: Male Account #: [id number] Procedure:                Upper GI endoscopy Indications:              Generalized abdominal pain Medicines:                Monitored Anesthesia Care Procedure:                Pre-Anesthesia Assessment:                           - Prior to the procedure, a History and Physical                            was performed, and patient medications and                            allergies were reviewed. The patient's tolerance of                            previous anesthesia was also reviewed. The risks                            and benefits of the procedure and the sedation                            options and risks were discussed with the patient.                            All questions were answered, and informed consent                            was obtained. Prior Anticoagulants: The patient has                            taken no previous anticoagulant or antiplatelet                            agents. ASA Grade Assessment: II - A patient with                            mild systemic disease. After reviewing the risks                            and benefits, the patient was deemed in                            satisfactory condition to undergo the procedure.                           After obtaining informed consent, the endoscope was  passed under direct vision. Throughout the                            procedure, the patient's blood pressure, pulse, and                            oxygen saturations were monitored continuously. The                            Endoscope was introduced through the mouth, and                            advanced to the second part of duodenum. The upper                            GI endoscopy was accomplished  without difficulty.                            The patient tolerated the procedure well. Scope In: Scope Out: Findings:                 A small hiatal hernia was present.                           The exam was otherwise without abnormality. Complications:            No immediate complications. Estimated blood loss:                            None. Estimated Blood Loss:     Estimated blood loss: none. Impression:               - Small hiatal hernia.                           - The examination was otherwise normal.                           - No specimens collected. Recommendation:           - Patient has a contact number available for                            emergencies. The signs and symptoms of potential                            delayed complications were discussed with the                            patient. Return to normal activities tomorrow.                            Written discharge instructions were provided to the                            patient.                           -  Resume previous diet.                           - Continue present medications. Rachael Feeaniel P Jacobs, MD 05/10/2017 1:37:29 PM This report has been signed electronically.

## 2017-05-10 NOTE — Progress Notes (Signed)
No egg or soy allergy  Pt's states no medical or surgical changes since previsit or office visit. 

## 2017-05-10 NOTE — Progress Notes (Signed)
Report to PACU, RN, vss, BBS= Clear.  

## 2017-05-10 NOTE — Patient Instructions (Signed)
YOU HAD AN ENDOSCOPIC PROCEDURE TODAY AT THE [facility]:   Refer to the procedure report that was given to you for any specific questions about what was found during the examination.  If the procedure report does not answer your questions, please call your gastroenterologist to clarify.  If you requested that your care partner not be given the details of your procedure findings, then the procedure report has been included in a sealed envelope for you to review at your convenience later.  YOU SHOULD EXPECT: Some feelings of bloating in the abdomen. Passage of more gas than usual.  Walking can help get rid of the air that was put into your GI tract during the procedure and reduce the bloating. If you had a lower endoscopy (such as a colonoscopy or flexible sigmoidoscopy) you may notice spotting of blood in your stool or on the toilet paper. If you underwent a bowel prep for your procedure, you may not have a normal bowel movement for a few days.  Please Note:  You might notice some irritation and congestion in your nose or some drainage.  This is from the oxygen used during your procedure.  There is no need for concern and it should clear up in a day or so.  SYMPTOMS TO REPORT IMMEDIATELY:   Following lower endoscopy (colonoscopy or flexible sigmoidoscopy):  Excessive amounts of blood in the stool  Significant tenderness or worsening of abdominal pains  Swelling of the abdomen that is new, acute  Fever of 100F or higher   Following upper endoscopy (EGD)  Vomiting of blood or coffee ground material  New chest pain or pain under the shoulder blades  Painful or persistently difficult swallowing  New shortness of breath  Fever of 100F or higher  Black, tarry-looking stools  For urgent or emergent issues, a gastroenterologist can be reached at any hour by calling [phone].   DIET:  We do recommend a small meal at first, but then you may proceed to your regular diet.  Drink  plenty of fluids but you should avoid alcoholic beverages for 24 hours.  ACTIVITY:  You should plan to take it easy for the rest of today and you should NOT DRIVE or use heavy machinery until tomorrow (because of the sedation medicines used during the test).    FOLLOW UP: Our staff will call the number listed on your records the next business day following your procedure to check on you and address any questions or concerns that you may have regarding the information given to you following your procedure. If we do not reach you, we will leave a message.  However, if you are feeling well and you are not experiencing any problems, there is no need to return our call.  We will assume that you have returned to your regular daily activities without incident.  If any biopsies were taken you will be contacted by phone or by letter within the next 1-3 weeks.  Please call us at [phone] if you have not heard about the biopsies in 3 weeks.    SIGNATURES/CONFIDENTIALITY: You and/or your care partner have signed paperwork which will be entered into your electronic medical record.  These signatures attest to the fact that that the information above on your After Visit Summary has been reviewed and is understood.  Full responsibility of the confidentiality of this discharge information lies with you and/or your care-partner.  Thank you for letting us take care of your healthcare needs   today. 

## 2017-05-10 NOTE — Op Note (Signed)
[facility] Patient Name: Zachary Herman Procedure Date: 05/10/2017 1:15 PM MRN: [id number] Endoscopist: Rachael Feeaniel P Jacobs , MD Age: 46 Referring MD:  Date of Birth: 18-Sep-1971 Gender: Male Account #: [id number] Procedure:                Colonoscopy Indications:              Generalized abdominal pain Medicines:                Monitored Anesthesia Care Procedure:                Pre-Anesthesia Assessment:                           - Prior to the procedure, a History and Physical                            was performed, and patient medications and                            allergies were reviewed. The patient's tolerance of                            previous anesthesia was also reviewed. The risks                            and benefits of the procedure and the sedation                            options and risks were discussed with the patient.                            All questions were answered, and informed consent                            was obtained. Prior Anticoagulants: The patient has                            taken no previous anticoagulant or antiplatelet                            agents. ASA Grade Assessment: II - A patient with                            mild systemic disease. After reviewing the risks                            and benefits, the patient was deemed in                            satisfactory condition to undergo the procedure.                           After obtaining informed consent, the colonoscope  was passed under direct vision. Throughout the                            procedure, the patient's blood pressure, pulse, and                            oxygen saturations were monitored continuously. The                            Model CF-HQ190L [id number]) scope was introduced                            through the anus and advanced to the the cecum,                            identified by appendiceal orifice and  ileocecal                            valve. The colonoscopy was performed without                            difficulty. The patient tolerated the procedure                            well. The quality of the bowel preparation was                            good. The ileocecal valve, appendiceal orifice, and                            rectum were photographed. Scope In: 1:22:13 PM Scope Out: 1:30:04 PM Scope Withdrawal Time: 0 hours 5 minutes 3 seconds  Total Procedure Duration: 0 hours 7 minutes 51 seconds  Findings:                 The entire examined colon appeared normal on direct                            and retroflexion views. Complications:            No immediate complications. Estimated blood loss:                            None. Estimated Blood Loss:     Estimated blood loss: none. Impression:               - The entire examined colon is normal on direct and                            retroflexion views.                           - No polyps, cancers or signs of inflammation. Recommendation:           - Patient has a contact number available for  emergencies. The signs and symptoms of potential                            delayed complications were discussed with the                            patient. Return to normal activities tomorrow.                            Written discharge instructions were provided to the                            patient.                           - Resume previous diet.                           - Continue present medications.                           - Repeat colonoscopy in 10 years for screening                            purposes. Rachael Fee, MD 05/10/2017 1:31:59 PM This report has been signed electronically.

## 2017-05-13 ENCOUNTER — Telehealth: Payer: Self-pay | Admitting: *Deleted

## 2017-05-13 NOTE — Telephone Encounter (Signed)
  Follow up Call-  Call back number 05/10/2017  Post procedure Call Back phone  # [phone]  Permission to leave phone message Yes  Some recent data might be hidden     Patient questions:  Do you have a fever, pain , or abdominal swelling? No. Pain Score  0 *  Have you tolerated food without any problems? Yes.    Have you been able to return to your normal activities? Yes.    Do you have any questions about your discharge instructions: Diet   No. Medications  No. Follow up visit  No.  Do you have questions or concerns about your Care? No.  Actions: * If pain score is 4 or above: No action needed, pain <4.

## 2017-05-15 DIAGNOSIS — M25512 Pain in left shoulder: Secondary | ICD-10-CM | POA: Diagnosis not present

## 2017-05-15 DIAGNOSIS — R1031 Right lower quadrant pain: Secondary | ICD-10-CM | POA: Diagnosis not present

## 2017-05-15 DIAGNOSIS — M25511 Pain in right shoulder: Secondary | ICD-10-CM | POA: Diagnosis not present

## 2017-05-15 DIAGNOSIS — M545 Low back pain: Secondary | ICD-10-CM | POA: Diagnosis not present

## 2017-05-20 DIAGNOSIS — S91302A Unspecified open wound, left foot, initial encounter: Secondary | ICD-10-CM | POA: Diagnosis not present

## 2017-06-25 DIAGNOSIS — M25512 Pain in left shoulder: Secondary | ICD-10-CM | POA: Diagnosis not present

## 2017-06-25 DIAGNOSIS — M25511 Pain in right shoulder: Secondary | ICD-10-CM | POA: Diagnosis not present

## 2017-06-25 DIAGNOSIS — M545 Low back pain: Secondary | ICD-10-CM | POA: Diagnosis not present

## 2017-06-25 DIAGNOSIS — R1031 Right lower quadrant pain: Secondary | ICD-10-CM | POA: Diagnosis not present

## 2017-07-22 DIAGNOSIS — Z0000 Encounter for general adult medical examination without abnormal findings: Secondary | ICD-10-CM | POA: Diagnosis not present

## 2017-07-22 DIAGNOSIS — Z125 Encounter for screening for malignant neoplasm of prostate: Secondary | ICD-10-CM | POA: Diagnosis not present

## 2017-07-24 DIAGNOSIS — M25511 Pain in right shoulder: Secondary | ICD-10-CM | POA: Diagnosis not present

## 2017-07-24 DIAGNOSIS — M25512 Pain in left shoulder: Secondary | ICD-10-CM | POA: Diagnosis not present

## 2017-07-24 DIAGNOSIS — M545 Low back pain: Secondary | ICD-10-CM | POA: Diagnosis not present

## 2017-07-24 DIAGNOSIS — R1031 Right lower quadrant pain: Secondary | ICD-10-CM | POA: Diagnosis not present

## 2017-07-29 DIAGNOSIS — G4733 Obstructive sleep apnea (adult) (pediatric): Secondary | ICD-10-CM | POA: Diagnosis not present

## 2017-07-29 DIAGNOSIS — K219 Gastro-esophageal reflux disease without esophagitis: Secondary | ICD-10-CM | POA: Diagnosis not present

## 2017-07-29 DIAGNOSIS — Z0000 Encounter for general adult medical examination without abnormal findings: Secondary | ICD-10-CM | POA: Diagnosis not present

## 2017-07-29 DIAGNOSIS — R7303 Prediabetes: Secondary | ICD-10-CM | POA: Diagnosis not present

## 2017-08-12 DIAGNOSIS — S90935A Unspecified superficial injury of left lesser toe(s), initial encounter: Secondary | ICD-10-CM | POA: Diagnosis not present

## 2017-09-03 DIAGNOSIS — M25511 Pain in right shoulder: Secondary | ICD-10-CM | POA: Diagnosis not present

## 2017-09-03 DIAGNOSIS — M25512 Pain in left shoulder: Secondary | ICD-10-CM | POA: Diagnosis not present

## 2017-09-03 DIAGNOSIS — M545 Low back pain: Secondary | ICD-10-CM | POA: Diagnosis not present

## 2017-09-03 DIAGNOSIS — R1031 Right lower quadrant pain: Secondary | ICD-10-CM | POA: Diagnosis not present

## 2017-09-12 DIAGNOSIS — Z23 Encounter for immunization: Secondary | ICD-10-CM | POA: Diagnosis not present

## 2017-09-16 DIAGNOSIS — F4322 Adjustment disorder with anxiety: Secondary | ICD-10-CM | POA: Diagnosis not present

## 2017-09-23 DIAGNOSIS — F4322 Adjustment disorder with anxiety: Secondary | ICD-10-CM | POA: Diagnosis not present

## 2017-09-30 DIAGNOSIS — F4322 Adjustment disorder with anxiety: Secondary | ICD-10-CM | POA: Diagnosis not present

## 2017-10-07 DIAGNOSIS — F4322 Adjustment disorder with anxiety: Secondary | ICD-10-CM | POA: Diagnosis not present

## 2017-10-09 DIAGNOSIS — M25512 Pain in left shoulder: Secondary | ICD-10-CM | POA: Diagnosis not present

## 2017-10-09 DIAGNOSIS — M545 Low back pain: Secondary | ICD-10-CM | POA: Diagnosis not present

## 2017-10-09 DIAGNOSIS — M25511 Pain in right shoulder: Secondary | ICD-10-CM | POA: Diagnosis not present

## 2017-10-09 DIAGNOSIS — R1031 Right lower quadrant pain: Secondary | ICD-10-CM | POA: Diagnosis not present

## 2017-10-14 DIAGNOSIS — G4733 Obstructive sleep apnea (adult) (pediatric): Secondary | ICD-10-CM | POA: Diagnosis not present

## 2017-10-21 DIAGNOSIS — F4322 Adjustment disorder with anxiety: Secondary | ICD-10-CM | POA: Diagnosis not present

## 2017-11-06 DIAGNOSIS — M25512 Pain in left shoulder: Secondary | ICD-10-CM | POA: Diagnosis not present

## 2017-11-06 DIAGNOSIS — R1031 Right lower quadrant pain: Secondary | ICD-10-CM | POA: Diagnosis not present

## 2017-11-06 DIAGNOSIS — M545 Low back pain: Secondary | ICD-10-CM | POA: Diagnosis not present

## 2017-11-06 DIAGNOSIS — M25511 Pain in right shoulder: Secondary | ICD-10-CM | POA: Diagnosis not present

## 2017-11-12 DIAGNOSIS — F902 Attention-deficit hyperactivity disorder, combined type: Secondary | ICD-10-CM | POA: Diagnosis not present

## 2017-11-12 DIAGNOSIS — Z79899 Other long term (current) drug therapy: Secondary | ICD-10-CM | POA: Diagnosis not present

## 2017-11-12 DIAGNOSIS — R41840 Attention and concentration deficit: Secondary | ICD-10-CM | POA: Diagnosis not present

## 2017-11-27 DIAGNOSIS — M25511 Pain in right shoulder: Secondary | ICD-10-CM | POA: Diagnosis not present

## 2017-11-27 DIAGNOSIS — R1031 Right lower quadrant pain: Secondary | ICD-10-CM | POA: Diagnosis not present

## 2017-11-27 DIAGNOSIS — M545 Low back pain: Secondary | ICD-10-CM | POA: Diagnosis not present

## 2017-11-27 DIAGNOSIS — M25512 Pain in left shoulder: Secondary | ICD-10-CM | POA: Diagnosis not present

## 2017-12-16 DIAGNOSIS — F902 Attention-deficit hyperactivity disorder, combined type: Secondary | ICD-10-CM | POA: Diagnosis not present

## 2017-12-16 DIAGNOSIS — Z79899 Other long term (current) drug therapy: Secondary | ICD-10-CM | POA: Diagnosis not present

## 2017-12-16 DIAGNOSIS — F4322 Adjustment disorder with anxiety: Secondary | ICD-10-CM | POA: Diagnosis not present

## 2017-12-26 DIAGNOSIS — M25512 Pain in left shoulder: Secondary | ICD-10-CM | POA: Diagnosis not present

## 2017-12-26 DIAGNOSIS — M25511 Pain in right shoulder: Secondary | ICD-10-CM | POA: Diagnosis not present

## 2017-12-26 DIAGNOSIS — R1031 Right lower quadrant pain: Secondary | ICD-10-CM | POA: Diagnosis not present

## 2017-12-26 DIAGNOSIS — M545 Low back pain: Secondary | ICD-10-CM | POA: Diagnosis not present

## 2018-03-06 DIAGNOSIS — M25512 Pain in left shoulder: Secondary | ICD-10-CM | POA: Diagnosis not present

## 2018-03-06 DIAGNOSIS — M545 Low back pain: Secondary | ICD-10-CM | POA: Diagnosis not present

## 2018-03-06 DIAGNOSIS — M25511 Pain in right shoulder: Secondary | ICD-10-CM | POA: Diagnosis not present

## 2018-03-06 DIAGNOSIS — R1031 Right lower quadrant pain: Secondary | ICD-10-CM | POA: Diagnosis not present

## 2018-03-10 DIAGNOSIS — Z79899 Other long term (current) drug therapy: Secondary | ICD-10-CM | POA: Diagnosis not present

## 2018-03-10 DIAGNOSIS — F902 Attention-deficit hyperactivity disorder, combined type: Secondary | ICD-10-CM | POA: Diagnosis not present

## 2018-03-13 DIAGNOSIS — M25512 Pain in left shoulder: Secondary | ICD-10-CM | POA: Diagnosis not present

## 2018-03-13 DIAGNOSIS — M25511 Pain in right shoulder: Secondary | ICD-10-CM | POA: Diagnosis not present

## 2018-03-13 DIAGNOSIS — R1031 Right lower quadrant pain: Secondary | ICD-10-CM | POA: Diagnosis not present

## 2018-03-13 DIAGNOSIS — M545 Low back pain: Secondary | ICD-10-CM | POA: Diagnosis not present

## 2018-03-19 DIAGNOSIS — M25512 Pain in left shoulder: Secondary | ICD-10-CM | POA: Diagnosis not present

## 2018-03-19 DIAGNOSIS — R1031 Right lower quadrant pain: Secondary | ICD-10-CM | POA: Diagnosis not present

## 2018-03-19 DIAGNOSIS — M25511 Pain in right shoulder: Secondary | ICD-10-CM | POA: Diagnosis not present

## 2018-03-19 DIAGNOSIS — M545 Low back pain: Secondary | ICD-10-CM | POA: Diagnosis not present

## 2018-04-02 DIAGNOSIS — H5213 Myopia, bilateral: Secondary | ICD-10-CM | POA: Diagnosis not present

## 2018-04-03 DIAGNOSIS — R1031 Right lower quadrant pain: Secondary | ICD-10-CM | POA: Diagnosis not present

## 2018-04-03 DIAGNOSIS — M25512 Pain in left shoulder: Secondary | ICD-10-CM | POA: Diagnosis not present

## 2018-04-03 DIAGNOSIS — M25511 Pain in right shoulder: Secondary | ICD-10-CM | POA: Diagnosis not present

## 2018-04-03 DIAGNOSIS — M545 Low back pain: Secondary | ICD-10-CM | POA: Diagnosis not present

## 2018-06-10 DIAGNOSIS — Z79899 Other long term (current) drug therapy: Secondary | ICD-10-CM | POA: Diagnosis not present

## 2018-06-10 DIAGNOSIS — F902 Attention-deficit hyperactivity disorder, combined type: Secondary | ICD-10-CM | POA: Diagnosis not present

## 2018-09-09 DIAGNOSIS — Z79899 Other long term (current) drug therapy: Secondary | ICD-10-CM | POA: Diagnosis not present

## 2018-09-09 DIAGNOSIS — F902 Attention-deficit hyperactivity disorder, combined type: Secondary | ICD-10-CM | POA: Diagnosis not present

## 2018-09-12 DIAGNOSIS — Z23 Encounter for immunization: Secondary | ICD-10-CM | POA: Diagnosis not present

## 2018-09-23 DIAGNOSIS — F4322 Adjustment disorder with anxiety: Secondary | ICD-10-CM | POA: Diagnosis not present

## 2018-12-15 DIAGNOSIS — Z79899 Other long term (current) drug therapy: Secondary | ICD-10-CM | POA: Diagnosis not present

## 2018-12-15 DIAGNOSIS — F902 Attention-deficit hyperactivity disorder, combined type: Secondary | ICD-10-CM | POA: Diagnosis not present

## 2019-02-11 DIAGNOSIS — S20211A Contusion of right front wall of thorax, initial encounter: Secondary | ICD-10-CM | POA: Diagnosis not present

## 2019-02-20 DIAGNOSIS — S8011XA Contusion of right lower leg, initial encounter: Secondary | ICD-10-CM | POA: Diagnosis not present

## 2019-03-18 DIAGNOSIS — F902 Attention-deficit hyperactivity disorder, combined type: Secondary | ICD-10-CM | POA: Diagnosis not present

## 2019-06-18 DIAGNOSIS — F902 Attention-deficit hyperactivity disorder, combined type: Secondary | ICD-10-CM | POA: Diagnosis not present

## 2019-08-04 DIAGNOSIS — G4733 Obstructive sleep apnea (adult) (pediatric): Secondary | ICD-10-CM | POA: Diagnosis not present

## 2019-08-14 DIAGNOSIS — H6122 Impacted cerumen, left ear: Secondary | ICD-10-CM | POA: Diagnosis not present

## 2019-08-14 DIAGNOSIS — H9012 Conductive hearing loss, unilateral, left ear, with unrestricted hearing on the contralateral side: Secondary | ICD-10-CM | POA: Diagnosis not present

## 2019-08-14 DIAGNOSIS — H60332 Swimmer's ear, left ear: Secondary | ICD-10-CM | POA: Diagnosis not present

## 2019-09-03 DIAGNOSIS — G4733 Obstructive sleep apnea (adult) (pediatric): Secondary | ICD-10-CM | POA: Diagnosis not present

## 2019-09-03 DIAGNOSIS — Z0000 Encounter for general adult medical examination without abnormal findings: Secondary | ICD-10-CM | POA: Diagnosis not present

## 2019-09-03 DIAGNOSIS — Z8249 Family history of ischemic heart disease and other diseases of the circulatory system: Secondary | ICD-10-CM | POA: Diagnosis not present

## 2019-09-03 DIAGNOSIS — R002 Palpitations: Secondary | ICD-10-CM | POA: Diagnosis not present

## 2019-09-04 DIAGNOSIS — E559 Vitamin D deficiency, unspecified: Secondary | ICD-10-CM | POA: Diagnosis not present

## 2019-09-04 DIAGNOSIS — E781 Pure hyperglyceridemia: Secondary | ICD-10-CM | POA: Diagnosis not present

## 2019-09-04 DIAGNOSIS — Z0000 Encounter for general adult medical examination without abnormal findings: Secondary | ICD-10-CM | POA: Diagnosis not present

## 2019-09-04 DIAGNOSIS — R7303 Prediabetes: Secondary | ICD-10-CM | POA: Diagnosis not present

## 2019-09-04 DIAGNOSIS — Z125 Encounter for screening for malignant neoplasm of prostate: Secondary | ICD-10-CM | POA: Diagnosis not present

## 2019-09-24 DIAGNOSIS — F900 Attention-deficit hyperactivity disorder, predominantly inattentive type: Secondary | ICD-10-CM | POA: Diagnosis not present

## 2019-09-26 DIAGNOSIS — R002 Palpitations: Secondary | ICD-10-CM | POA: Diagnosis not present

## 2019-09-28 DIAGNOSIS — R002 Palpitations: Secondary | ICD-10-CM | POA: Diagnosis not present

## 2019-09-30 DIAGNOSIS — F902 Attention-deficit hyperactivity disorder, combined type: Secondary | ICD-10-CM | POA: Diagnosis not present

## 2019-09-30 DIAGNOSIS — Z79899 Other long term (current) drug therapy: Secondary | ICD-10-CM | POA: Diagnosis not present

## 2019-10-01 DIAGNOSIS — F900 Attention-deficit hyperactivity disorder, predominantly inattentive type: Secondary | ICD-10-CM | POA: Diagnosis not present

## 2019-10-08 DIAGNOSIS — F900 Attention-deficit hyperactivity disorder, predominantly inattentive type: Secondary | ICD-10-CM | POA: Diagnosis not present

## 2019-10-15 DIAGNOSIS — F900 Attention-deficit hyperactivity disorder, predominantly inattentive type: Secondary | ICD-10-CM | POA: Diagnosis not present

## 2019-10-21 DIAGNOSIS — H5213 Myopia, bilateral: Secondary | ICD-10-CM | POA: Diagnosis not present

## 2019-10-22 DIAGNOSIS — F900 Attention-deficit hyperactivity disorder, predominantly inattentive type: Secondary | ICD-10-CM | POA: Diagnosis not present

## 2019-10-29 DIAGNOSIS — F900 Attention-deficit hyperactivity disorder, predominantly inattentive type: Secondary | ICD-10-CM | POA: Diagnosis not present

## 2019-11-11 ENCOUNTER — Encounter: Payer: Self-pay | Admitting: Cardiology

## 2019-11-12 ENCOUNTER — Ambulatory Visit: Payer: BC Managed Care – PPO | Admitting: Cardiology

## 2019-11-12 ENCOUNTER — Encounter: Payer: Self-pay | Admitting: Cardiology

## 2019-11-12 ENCOUNTER — Other Ambulatory Visit: Payer: Self-pay

## 2019-11-12 VITALS — BP 154/92 | HR 87 | Temp 97.9°F | Ht 68.0 in | Wt 252.8 lb

## 2019-11-12 DIAGNOSIS — R002 Palpitations: Secondary | ICD-10-CM

## 2019-11-12 NOTE — Progress Notes (Signed)
Primary Care Provider: Irena Reichmannollins, Dana, DO Cardiologist: No primary care provider on file. Electrophysiologist:  n/a  Clinic Note: No chief complaint on file.  HPI:    Zachary Herman is a 48 y.o. male with OSA on CPAP and ADHD who is being seen today for the evaluation of abnormal findings on Zio patch monitor suggesting one episode of SVT and SINUS TACHYCARDIA at the request of Georgianne Fickamachandran, Ajith, MD.  Zachary Herman was last seen on August 03, 2019 by his PCP for establishment of primary care and obtaining his annual physical.  He had multiple concerns.  He noted heart palpitations and fluttering for about a week or so and was concerned about his father being in the [place] in [place] heart attack. ->  Zio patch ordered  Recent Hospitalizations: N/A  Reviewed  CV studies:    The following studies were reviewed today: (if available, images/films reviewed: From Epic Chart or Care Everywhere) . Zio patch ordered and completed with results sent to PCP.  Not available at the time of his visit.  Referral indication suggests sinus tachycardia with one episode of SVT but details not available.:   Interval History:   Zachary Herman presents here today to discuss results of the Zio patch monitor that unfortunately do not have.  He is describing episodes of fluttering the go off and on that he initially felt were related to GI symptoms because they are often relieved with burping or passing gas.  He says these episodes are very fleeting usually lasting a few seconds here and there and he never had a sensation whatsoever of fast heart rates.  He says his heart rate may be fast sometimes at baseline, but he has not had any syncope or near syncope associated with it.  Usually the episodes happen at the end of the night when he is try to settle down or need for stress going in the morning.  They do not usually occur when he is active.  He also notes some when he bends over.  On average his episodes  only last a few seconds.  CV Review of Symptoms (Summary): no chest pain or dyspnea on exertion positive for - irregular heartbeat and palpitations negative for - edema, orthopnea, paroxysmal nocturnal dyspnea, rapid heart rate, shortness of breath or Syncope/near syncope, TIA/amaurosis fugax, claudication  The patient does not have symptoms concerning for COVID-19 infection (fever, chills, cough, or new shortness of breath).  The patient is practicing social distancing. ++ Masking.  He routinely goes out for groceries/shopping.  Safe at work   REVIEWED OF SYSTEMS   A comprehensive ROS was performed. Review of Systems  Constitutional: Negative for malaise/fatigue and weight loss.  HENT: Negative for congestion and sinus pain.   Respiratory: Negative for shortness of breath and wheezing.   Gastrointestinal: Negative for blood in stool, heartburn and melena.  Genitourinary: Negative for hematuria.  Musculoskeletal: Negative for falls and joint pain.  Psychiatric/Behavioral: Negative for depression and memory loss. The patient is nervous/anxious and has insomnia.        ADHD seems to be relatively controlled now.  All other systems reviewed and are negative.  I have reviewed and (if needed) personally updated the patient's problem list, medications, allergies, past medical and surgical history, social and family history.   PAST MEDICAL HISTORY   Past Medical History:  Diagnosis Date  . Adult ADHD   . Allergic rhinitis   . Esophageal reflux   . High triglycerides   .  Hyperlipidemia, mild    On fish oil.  . IBS (irritable bowel syndrome)   . Overweight   . Sleep apnea      PAST SURGICAL HISTORY   Past Surgical History:  Procedure Laterality Date  . None       MEDICATIONS/ALLERGIES   Current Meds  Medication Sig  . Amphet-Dextroamphet 3-Bead ER (MYDAYIS) 37.5 MG CP24 Take 1 capsule by mouth daily.  Marland Kitchen b complex vitamins tablet Take 1 tablet by mouth daily.  .  cholecalciferol (VITAMIN D3) 25 MCG (1000 UT) tablet Take 1,000 Units by mouth daily.  . ciprofloxacin (CILOXAN) 0.3 % ophthalmic solution Not using currently  . co-enzyme Q-10 30 MG capsule Take 30 mg by mouth 3 (three) times daily.  . ferrous sulfate 325 (65 FE) MG EC tablet Take 325 mg by mouth 3 (three) times daily with meals.  . magnesium 30 MG tablet Take 30 mg by mouth 2 (two) times daily.  . Omega-3 Fatty Acids (FISH OIL) 1000 MG CAPS Take by mouth.  . prednisoLONE acetate (PRED FORTE) 1 % ophthalmic suspension Not using currently   Current Facility-Administered Medications for the 11/12/19 encounter (Office Visit) with Marykay Lex, MD  Medication  . 0.9 %  sodium chloride infusion    Allergies  Allergen Reactions  . Tetracyclines & Related     Skin reaction     SOCIAL HISTORY/FAMILY HISTORY   Social History   Tobacco Use  . Smoking status: Former Games developer  . Smokeless tobacco: Never Used  . Tobacco comment: Quit 18+ years  Substance Use Topics  . Alcohol use: Yes    Alcohol/week: 3.0 standard drinks    Types: 3 Standard drinks or equivalent per week    Comment: social wine, 1 glass about 3-4 times per week  . 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   He lives with wife and son at home.      He walks 2-3 times a day 20 to 30 minutes.  5 to 7 days a week    Family History family history includes Diabetes in his father and paternal grandfather; Healthy in his brother, mother, and son; Hypertension in his father; Lymphoma in his father; Stroke in his father.   OBJCTIVE -PE, EKG, labs   Wt Readings from Last 3 Encounters:  11/12/19 252 lb 12.8 oz (114.7 kg)  05/10/17 243 lb (110.2 kg)  03/06/17 243 lb 8 oz (110.5 kg)    Physical Exam: BP (!) 154/92   Pulse 87    Temp 97.9 F (36.6 C)   Ht 5\' 8"  (1.727 m)   Wt 252 lb 12.8 oz (114.7 kg)   SpO2 97%   BMI 38.44 kg/m  Physical Exam  Constitutional:  Healthy-appearing.  Well-groomed.  Moderately obese.  HENT:  Head: Normocephalic.  Eyes: Pupils are equal, round, and reactive to light. Conjunctivae are normal. No scleral icterus.  Neck: Normal range of motion. Neck supple. No hepatojugular reflux and no JVD present. Carotid bruit is not present. No thyromegaly present.  Cardiovascular: Normal rate, regular rhythm, intact distal pulses and normal pulses.  Occasional extrasystoles are present. PMI is not displaced. Exam reveals no gallop and no friction rub.  No murmur heard. Vitals reviewed.   Adult  ECG Report n/a  Recent Labs: September 04, 2019: TC 150, TG 187, HDL 37, LDL 81.  Lab Results  Component Value Date   CREATININE 0.94 03/06/2017   BUN 14 03/06/2017   NA 138 03/06/2017   K 4.5 03/06/2017   CL 101 03/06/2017   CO2 31 03/06/2017    ASSESSMENT/PLAN    Problem List Items Addressed This Visit    Palpitations - Primary    This morning to tell, what his symptoms sound like a probably just little bursts of PACs or PVCs.  Potentially bigeminy. Very unlikely that he is actually having frequent episodes of the brief run of SVT that was noted.  Because it was a suggested an SVT, we discussed vagal maneuvers. Also discussed avoiding triggers such as anxiety stress, sugar, caffeine etc.  He would like to avoid any medications, and I am also big unfamiliar with his ADHD medication. He does have some high blood pressure recordings and therefore if we did not want to treat, would probably consider rate control agent it as a first option. The question is how does it beta-blocker interact with his ADHD medication.  Need to get the results of his monitor.  If there is anything concerning there we will could get back and hold him to let him know.  Otherwise we will reassess in 6 months to see  how symptoms are doing.        COVID-19 Education: The signs and symptoms of COVID-19 were discussed with the patient and how to seek care for testing (follow up with PCP or arrange E-visit).   The importance of social distancing was discussed today.  I spent a total of 22 minutes with the patient and chart review. >  50% of the time was spent in direct patient consultation.  Additional time spent with chart review (studies, outside notes, etc): 6 Total Time: 28 min   Current medicines are reviewed at length with the patient today.  (+/- concerns) ->  He would like to avoid medications as possible.  He is not sure how anything would interact with his ADHD medicine-Mydayis   Patient Instructions / Medication Changes & Studies & Tests Ordered   Patient Instructions  Medication Instructions:   no changes    Lab Work: Not needed  Testing/Procedures: Not needed  Follow-Up: At [facility], you and your health needs are our priority.  As part of our continuing mission to provide you with exceptional heart care, we have created designated Provider Care Teams.  These Care Teams include your primary Cardiologist (physician) and Advanced Practice Providers (APPs -  Physician Assistants and Nurse Practitioners) who all work together to provide you with the care you need, when you need it.  Your next appointment:   6 month(s)  The format for your next appointment:   Virtual Visit   Provider:   Glenetta Hew, MD  Other Instructions  Discuss watch for triggers - not Getting enough rest ,  The use of caffeine , Limiting  Stress , keeping hydrated  Can try vagal maneuvers Recommendations for vagal maneuvers:  "Bearing down"  Coughing  Gagging  Icy, cold towel on face or drink ice cold water    Studies Ordered:   No orders of the defined types were placed in this encounter.    Glenetta Hew, M.D., M.S. Interventional Cardiologist   Pager # [phone] Phone #  [phone] [address] [place], [place] [address]   Thank you for choosing [facility] at  [facility]!!

## 2019-11-12 NOTE — Patient Instructions (Addendum)
Medication Instructions:   no changes    Lab Work: Not needed  Testing/Procedures: Not needed  Follow-Up: At [facility], you and your health needs are our priority.  As part of our continuing mission to provide you with exceptional heart care, we have created designated Provider Care Teams.  These Care Teams include your primary Cardiologist (physician) and Advanced Practice Providers (APPs -  Physician Assistants and Nurse Practitioners) who all work together to provide you with the care you need, when you need it.  Your next appointment:   6 month(s)  The format for your next appointment:   Virtual Visit   Provider:   Glenetta Hew, MD  Other Instructions  Discuss watch for triggers - not Getting enough rest ,  The use of caffeine , Limiting  Stress , keeping hydrated  Can try vagal maneuvers Recommendations for vagal maneuvers:  "Bearing down"  Coughing  Gagging  Icy, cold towel on face or drink ice cold water

## 2019-11-13 DIAGNOSIS — F900 Attention-deficit hyperactivity disorder, predominantly inattentive type: Secondary | ICD-10-CM | POA: Diagnosis not present

## 2019-11-15 ENCOUNTER — Encounter: Payer: Self-pay | Admitting: Cardiology

## 2019-11-15 DIAGNOSIS — R002 Palpitations: Secondary | ICD-10-CM | POA: Insufficient documentation

## 2019-11-15 NOTE — Assessment & Plan Note (Signed)
This morning to tell, what his symptoms sound like a probably just little bursts of PACs or PVCs.  Potentially bigeminy. Very unlikely that he is actually having frequent episodes of the brief run of SVT that was noted.  Because it was a suggested an SVT, we discussed vagal maneuvers. Also discussed avoiding triggers such as anxiety stress, sugar, caffeine etc.  He would like to avoid any medications, and I am also big unfamiliar with his ADHD medication. He does have some high blood pressure recordings and therefore if we did not want to treat, would probably consider rate control agent it as a first option. The question is how does it beta-blocker interact with his ADHD medication.  Need to get the results of his monitor.  If there is anything concerning there we will could get back and hold him to let him know.  Otherwise we will reassess in 6 months to see how symptoms are doing.

## 2019-11-20 DIAGNOSIS — F900 Attention-deficit hyperactivity disorder, predominantly inattentive type: Secondary | ICD-10-CM | POA: Diagnosis not present

## 2019-11-24 ENCOUNTER — Telehealth (INDEPENDENT_AMBULATORY_CARE_PROVIDER_SITE_OTHER): Payer: BC Managed Care – PPO | Admitting: Cardiology

## 2019-11-24 DIAGNOSIS — R002 Palpitations: Secondary | ICD-10-CM

## 2019-11-24 NOTE — Telephone Encounter (Signed)
Patient calling for heart monitor results. Saw Dr. Ellyn Hack on 11/12/19.

## 2019-11-24 NOTE — Telephone Encounter (Signed)
Pt calling for Zio patch findings Will forward to Trixie Dredge RN to check to see if received results ./cy

## 2019-11-27 NOTE — Telephone Encounter (Signed)
I do not have the study back yet to read.  Glenetta Hew, MD

## 2019-11-27 NOTE — Telephone Encounter (Signed)
I reviewed the telemetry monitor.  The results are essentially reassuring.   Minimum heart rate 55 bpm, maximum heart rate 155 bpm (both sinus rhythm).  The predominant rhythm was sinus rhythm which is normal.  There were rare premature atrial beats with a few couplets, triplets and one episode of 4 beats (when there are more than 4 beats that is considered a rhythm hence supraventricular tachycardia or paroxysmal atrial tachycardia).  This 1 episode was 4 beats that was patient triggered.  Predominantly triggered events were premature atrial beats. There were very very rare future ventricular beats that were also triggered, but less frequent.   All told, less than 1% of the beats were premature beats, other than that 1 4 beat run, no arrhythmias. Immature beats came in rates ranging from 60 to 110 bpm.   This is pretty reassuring.  At this point I would not recommend treating with any type medications.  I think reassurance is the main goal here I do not think this is related to his ADHD medication, he just may be hyper aware of the symptoms.  These are generally not worrisome, especially at less than 1%.  Glenetta Hew, MD

## 2019-11-30 NOTE — Telephone Encounter (Signed)
Left detail message on voicemail concerning monitor, and as well copied  Dr Ellyn Hack comments of monitor to patient mychart for review any question may call back

## 2019-12-09 ENCOUNTER — Other Ambulatory Visit: Payer: Self-pay | Admitting: Cardiology

## 2019-12-10 DIAGNOSIS — G4733 Obstructive sleep apnea (adult) (pediatric): Secondary | ICD-10-CM | POA: Diagnosis not present

## 2019-12-29 DIAGNOSIS — Z79899 Other long term (current) drug therapy: Secondary | ICD-10-CM | POA: Diagnosis not present

## 2019-12-29 DIAGNOSIS — F902 Attention-deficit hyperactivity disorder, combined type: Secondary | ICD-10-CM | POA: Diagnosis not present

## 2019-12-31 DIAGNOSIS — J029 Acute pharyngitis, unspecified: Secondary | ICD-10-CM | POA: Diagnosis not present

## 2019-12-31 DIAGNOSIS — R030 Elevated blood-pressure reading, without diagnosis of hypertension: Secondary | ICD-10-CM | POA: Diagnosis not present

## 2019-12-31 DIAGNOSIS — Z20822 Contact with and (suspected) exposure to covid-19: Secondary | ICD-10-CM | POA: Diagnosis not present

## 2020-02-23 DIAGNOSIS — I10 Essential (primary) hypertension: Secondary | ICD-10-CM | POA: Diagnosis not present

## 2020-03-14 DIAGNOSIS — I10 Essential (primary) hypertension: Secondary | ICD-10-CM | POA: Diagnosis not present

## 2020-03-14 DIAGNOSIS — E559 Vitamin D deficiency, unspecified: Secondary | ICD-10-CM | POA: Diagnosis not present

## 2020-03-14 DIAGNOSIS — R7303 Prediabetes: Secondary | ICD-10-CM | POA: Diagnosis not present

## 2020-03-14 DIAGNOSIS — R635 Abnormal weight gain: Secondary | ICD-10-CM | POA: Diagnosis not present

## 2020-03-14 DIAGNOSIS — E781 Pure hyperglyceridemia: Secondary | ICD-10-CM | POA: Diagnosis not present

## 2020-03-21 DIAGNOSIS — R7303 Prediabetes: Secondary | ICD-10-CM | POA: Diagnosis not present

## 2020-03-21 DIAGNOSIS — G4733 Obstructive sleep apnea (adult) (pediatric): Secondary | ICD-10-CM | POA: Diagnosis not present

## 2020-03-21 DIAGNOSIS — I10 Essential (primary) hypertension: Secondary | ICD-10-CM | POA: Diagnosis not present

## 2020-03-24 DIAGNOSIS — G4733 Obstructive sleep apnea (adult) (pediatric): Secondary | ICD-10-CM | POA: Diagnosis not present

## 2020-03-24 DIAGNOSIS — F902 Attention-deficit hyperactivity disorder, combined type: Secondary | ICD-10-CM | POA: Diagnosis not present

## 2020-03-24 DIAGNOSIS — Z79899 Other long term (current) drug therapy: Secondary | ICD-10-CM | POA: Diagnosis not present

## 2020-06-22 DIAGNOSIS — R1030 Lower abdominal pain, unspecified: Secondary | ICD-10-CM | POA: Diagnosis not present

## 2020-06-22 DIAGNOSIS — M545 Low back pain: Secondary | ICD-10-CM | POA: Diagnosis not present

## 2020-07-18 DIAGNOSIS — Z79899 Other long term (current) drug therapy: Secondary | ICD-10-CM | POA: Diagnosis not present

## 2020-07-18 DIAGNOSIS — G4733 Obstructive sleep apnea (adult) (pediatric): Secondary | ICD-10-CM | POA: Diagnosis not present

## 2020-07-18 DIAGNOSIS — F902 Attention-deficit hyperactivity disorder, combined type: Secondary | ICD-10-CM | POA: Diagnosis not present

## 2020-08-24 ENCOUNTER — Other Ambulatory Visit: Payer: Self-pay

## 2020-08-24 DIAGNOSIS — Z20822 Contact with and (suspected) exposure to covid-19: Secondary | ICD-10-CM | POA: Diagnosis not present

## 2020-08-26 LAB — NOVEL CORONAVIRUS, NAA: SARS-CoV-2, NAA: NOT DETECTED

## 2020-08-26 LAB — SARS-COV-2, NAA 2 DAY TAT

## 2020-09-12 DIAGNOSIS — K6289 Other specified diseases of anus and rectum: Secondary | ICD-10-CM | POA: Diagnosis not present

## 2020-09-13 DIAGNOSIS — Z23 Encounter for immunization: Secondary | ICD-10-CM | POA: Diagnosis not present

## 2020-09-19 DIAGNOSIS — Z125 Encounter for screening for malignant neoplasm of prostate: Secondary | ICD-10-CM | POA: Diagnosis not present

## 2020-09-19 DIAGNOSIS — Z0000 Encounter for general adult medical examination without abnormal findings: Secondary | ICD-10-CM | POA: Diagnosis not present

## 2020-09-19 DIAGNOSIS — R7303 Prediabetes: Secondary | ICD-10-CM | POA: Diagnosis not present

## 2020-09-19 DIAGNOSIS — I10 Essential (primary) hypertension: Secondary | ICD-10-CM | POA: Diagnosis not present

## 2020-09-26 DIAGNOSIS — R0989 Other specified symptoms and signs involving the circulatory and respiratory systems: Secondary | ICD-10-CM | POA: Diagnosis not present

## 2020-09-26 DIAGNOSIS — E781 Pure hyperglyceridemia: Secondary | ICD-10-CM | POA: Diagnosis not present

## 2020-09-26 DIAGNOSIS — H9311 Tinnitus, right ear: Secondary | ICD-10-CM | POA: Diagnosis not present

## 2020-09-26 DIAGNOSIS — Z0000 Encounter for general adult medical examination without abnormal findings: Secondary | ICD-10-CM | POA: Diagnosis not present

## 2020-09-26 DIAGNOSIS — R7303 Prediabetes: Secondary | ICD-10-CM | POA: Diagnosis not present

## 2020-09-26 DIAGNOSIS — I10 Essential (primary) hypertension: Secondary | ICD-10-CM | POA: Diagnosis not present

## 2020-09-26 DIAGNOSIS — Z23 Encounter for immunization: Secondary | ICD-10-CM | POA: Diagnosis not present

## 2020-10-18 DIAGNOSIS — G4733 Obstructive sleep apnea (adult) (pediatric): Secondary | ICD-10-CM | POA: Diagnosis not present

## 2020-10-18 DIAGNOSIS — Z79899 Other long term (current) drug therapy: Secondary | ICD-10-CM | POA: Diagnosis not present

## 2020-10-18 DIAGNOSIS — F902 Attention-deficit hyperactivity disorder, combined type: Secondary | ICD-10-CM | POA: Diagnosis not present

## 2020-11-11 ENCOUNTER — Ambulatory Visit: Payer: BC Managed Care – PPO | Admitting: Gastroenterology

## 2020-11-11 ENCOUNTER — Encounter: Payer: Self-pay | Admitting: Gastroenterology

## 2020-11-11 VITALS — BP 130/90 | HR 92 | Ht 67.75 in | Wt 253.5 lb

## 2020-11-11 DIAGNOSIS — K602 Anal fissure, unspecified: Secondary | ICD-10-CM | POA: Diagnosis not present

## 2020-11-11 DIAGNOSIS — K649 Unspecified hemorrhoids: Secondary | ICD-10-CM | POA: Diagnosis not present

## 2020-11-11 MED ORDER — AMBULATORY NON FORMULARY MEDICATION: 0 refills | Status: DC

## 2020-11-11 NOTE — Patient Instructions (Addendum)
If you are age 51 or older, your body mass index should be between 23-30. Your Body mass index is 38.83 kg/m. If this is out of the aforementioned range listed, please consider follow up with your Primary Care Provider.  If you are age 56 or younger, your body mass index should be between 19-25. Your Body mass index is 38.83 kg/m. If this is out of the aformentioned range listed, please consider follow up with your Primary Care Provider.   We have sent the following medications to your pharmacy for you to pick up at your convenience:  Diltiazem Gel ointment 2%, apply two times daily every day and then for 1 month after your pain is gone. Up to first knuckle.  We have sent a prescription for Diltiazem gel ointment to [facility].   [facility]'s information is below: Address: [address], [place], [place] [address]  [phone]  *Please DO NOT go directly from our office to pick up this medication! Give the pharmacy 1 day to process the prescription as this is compounded and takes time to make.  Recticare samples given: apply 2-3 times daily.  Sitz baths twice daily.  Please start taking citrucel (orange flavored) powder fiber supplement.  This may cause some bloating at first but that usually goes away. Begin with a small spoonful and work your way up to a large, heaping spoonful daily over a week.  You are scheduled to follow up in the office on 12-30-20 at 11:10am.  Thank you for entrusting me with your care and choosing [facility].  Dr Christella Hartigan

## 2020-11-11 NOTE — Progress Notes (Signed)
Review of pertinent gastrointestinal problems: 1.  Small hiatal hernia noted on EGD June 2018 done for generalized abdominal pains. 2.  Routine risk for colon cancer.  Colonoscopy June 2018 was completely normal.  He was recommended to have repeat colonoscopy at 10-year interval for screening.   HPI: This is a very pleasant 49 year old man  I last saw him at the time of colonoscopy and upper endoscopy a little over 3 years ago.  See those results summarized above.  He is here today for new problem.  For the past 2 or 3 months he has had sharp, stabbing anal discomfort and intermittent bleeding.  These are worse after a bowel movement.  He feels a spasm 7.  He really is not bothered by constipation.  He has done some sitz bath's twice a day sometimes once a day.  He has used Preparation H and also some lidocaine suppositories with some relief but not complete.  Overall for the past week or so he is actually been feeling a bit better.  No fevers or chills, no abdominal pain, no weight loss  Review of systems: Pertinent positive and negative review of systems were noted in the above HPI section. All other review negative.   Past Medical History:  Diagnosis Date  . Adult ADHD   . Allergic rhinitis   . Esophageal reflux   . High triglycerides   . Hyperlipidemia, mild    On fish oil.  Marland Kitchen Hypertension   . IBS (irritable bowel syndrome)   . Overweight   . Pre-diabetes   . Sleep apnea    APAP    Past Surgical History:  Procedure Laterality Date  . COLONOSCOPY    . TYMPANOSTOMY TUBE PLACEMENT      Current Outpatient Medications  Medication Sig Dispense Refill  . amLODipine (NORVASC) 10 MG tablet Take 10 mg by mouth daily.    Marland Kitchen b complex vitamins tablet Take 1 tablet by mouth daily.    . cholecalciferol (VITAMIN D3) 25 MCG (1000 UT) tablet Take 1,000 Units by mouth daily.    . ciprofloxacin (CILOXAN) 0.3 % ophthalmic solution Not using currently    . co-enzyme Q-10 30 MG capsule  Take 30 mg by mouth 3 (three) times daily.    . ferrous sulfate 325 (65 FE) MG EC tablet Take 325 mg by mouth 3 (three) times daily with meals.    . magnesium 30 MG tablet Take 30 mg by mouth 2 (two) times daily.    . Omega-3 Fatty Acids (FISH OIL) 1000 MG CAPS Take by mouth.    . prednisoLONE acetate (PRED FORTE) 1 % ophthalmic suspension Not using currently    . valsartan (DIOVAN) 320 MG tablet Take 320 mg by mouth daily.    Marland Kitchen VYVANSE 60 MG capsule Take 60 mg by mouth daily.    . cyclobenzaprine (FLEXERIL) 10 MG tablet Take 10 mg by mouth as needed.  (Patient not taking: Reported on 11/11/2020)     Current Facility-Administered Medications  Medication Dose Route Frequency Provider Last Rate Last Admin  . 0.9 %  sodium chloride infusion  500 mL Intravenous Continuous Rachael Fee, MD        Allergies as of 11/11/2020 - Review Complete 11/11/2020  Allergen Reaction Noted  . Tetracyclines & related  10/25/2014    Family History  Problem Relation Age of Onset  . Healthy Mother        Living, 41  . Lymphoma Father   . Diabetes Father   .  Hypertension Father   . Stroke Father   . Heart disease Father   . Heart attack Father   . Healthy Brother   . Healthy Son   . Diabetes Paternal Grandfather   . Breast cancer Paternal Aunt   . Breast cancer Maternal Aunt     Social History   Socioeconomic History  . Marital status: Married    Spouse name: Maralyn Sago  . Number of children: 1  . Years of education: Tax adviser  . Highest education level: Not on file  Occupational History  . Occupation: Market researcher    Comment: [employer]  Tobacco Use  . Smoking status: Former Smoker    Types: Cigarettes    Quit date: 01/23/1998    Years since quitting: 22.8  . Smokeless tobacco: Never Used  . Tobacco comment: Quit 18+ years  Vaping Use  . Vaping Use: Never used  Substance and Sexual Activity  . Alcohol use: Yes    Alcohol/week: 3.0 standard drinks    Types: 3 Standard drinks or equivalent  per week    Comment: social wine, 1 glass about 3-4 times per week  . Drug use: No    Comment: Previously meth, LSD, marijuana   . Sexual activity: Yes    Partners: Female  Other Topics Concern  . 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   He lives with wife and son at home.      He walks 2-3 times a day 20 to 30 minutes.  5 to 7 days a week   Social Determinants of Health   Financial Resource Strain:   . Difficulty of Paying Living Expenses: Not on file  Food Insecurity:   . Worried About Programme researcher, broadcasting/film/video in the Last Year: Not on file  . Ran Out of Food in the Last Year: Not on file  Transportation Needs:   . Lack of Transportation (Medical): Not on file  . Lack of Transportation (Non-Medical): Not on file  Physical Activity:   . Days of Exercise per Week: Not on file  . Minutes of Exercise per Session: Not on file  Stress:   . Feeling of Stress : Not on file  Social Connections:   . Frequency of Communication with Friends and Family: Not on file  . Frequency of Social Gatherings with Friends and Family: Not on file  . Attends Religious Services: Not on file  . Active Member of Clubs or Organizations: Not on file  . Attends Banker Meetings: Not on file  . Marital Status: Not on file  Intimate Partner Violence:   . Fear of Current or Ex-Partner: Not on file  . Emotionally Abused: Not on file  . Physically Abused: Not on file  . Sexually Abused: Not on file     Physical Exam: BP 130/90 (BP Location: Left Arm, Patient Position: Sitting, Cuff Size: Normal)   Pulse 92   Ht 5' 7.75" (1.721 m) Comment: height measured without shoes  Wt 253 lb 8 oz (115 kg)   BMI 38.83 kg/m  Constitutional: generally well-appearing Psychiatric: alert and oriented x3 Eyes: extraocular movements intact Mouth: oral pharynx moist, no  lesions Neck: supple no lymphadenopathy Cardiovascular: heart regular rate and rhythm Lungs: clear to auscultation bilaterally Abdomen: soft, nontender, nondistended, no obvious ascites, no peritoneal signs, normal bowel sounds  Extremities: no lower extremity edema bilaterally Skin: no lesions on visible extremities Rectal examination with male assistant in the room: Midline posterior about 1 cm of granulation tissue which looks to be the site of a healing anal fissure also small internal anal hemorrhoid.  Assessment and plan: 49 y.o. male with anal fissure, internal hemorrhoid  I think the fissure is his primary problem here and likely this has caused some spasm in his anus is setting him up for hemorrhoidal problems as well.  These seem to be healing.  I recommended he continue sitz bath's twice daily and apply diltiazem gel twice daily afterwards.  We have given him samples of RectiCare.  He will also try to bulk his stools with fiber supplement.  He will return to see me in 6 to 8 weeks and knows if he is really completely feeling back to normal then he can cancel an appointment.  I do not think he needs a colonoscopy or other invasive testing at this point.   Please see the "Patient Instructions" section for addition details about the plan.   Rob Bunting, MD [facility] 11/11/2020, 9:47 AM  Cc: Irena Reichmann, DO  Total time on date of encounter was 45  minutes (this included time spent preparing to see the patient reviewing records; obtaining and/or reviewing separately obtained history; performing a medically appropriate exam and/or evaluation; counseling and educating the patient and family if present; ordering medications, tests or procedures if applicable; and documenting clinical information in the health record).

## 2020-11-22 DIAGNOSIS — H5213 Myopia, bilateral: Secondary | ICD-10-CM | POA: Diagnosis not present

## 2020-11-22 DIAGNOSIS — H524 Presbyopia: Secondary | ICD-10-CM | POA: Diagnosis not present

## 2020-12-27 ENCOUNTER — Telehealth: Payer: Self-pay | Admitting: Gastroenterology

## 2020-12-27 MED ORDER — AMBULATORY NON FORMULARY MEDICATION: 2 refills | Status: DC

## 2020-12-27 NOTE — Telephone Encounter (Signed)
Pt Is requesting a refill on his Diltiazem gel.  [employer]

## 2020-12-27 NOTE — Telephone Encounter (Signed)
The pt has decided that he feels much better and wants to cancel the appt for follow up.  Appt has been cancelled.  He will call back if his symptoms return.

## 2020-12-27 NOTE — Telephone Encounter (Signed)
Pt is scheduled for a f/u with Dr. Christella Hartigan on 1/21. Pt wants to know if he should r/s since he is doing better or if it is a good idea to keep appt.

## 2020-12-27 NOTE — Telephone Encounter (Signed)
Rx printed to send to [facility] as requested.

## 2020-12-27 NOTE — Telephone Encounter (Signed)
Yes, that is okay to refill for 1 month with 2 refills.  Thanks

## 2020-12-30 ENCOUNTER — Ambulatory Visit: Payer: BC Managed Care – PPO | Admitting: Gastroenterology

## 2021-01-20 DIAGNOSIS — R635 Abnormal weight gain: Secondary | ICD-10-CM | POA: Diagnosis not present

## 2021-01-20 DIAGNOSIS — F902 Attention-deficit hyperactivity disorder, combined type: Secondary | ICD-10-CM | POA: Diagnosis not present

## 2021-01-20 DIAGNOSIS — R7303 Prediabetes: Secondary | ICD-10-CM | POA: Diagnosis not present

## 2021-01-20 DIAGNOSIS — G4733 Obstructive sleep apnea (adult) (pediatric): Secondary | ICD-10-CM | POA: Diagnosis not present

## 2021-01-20 DIAGNOSIS — Z79899 Other long term (current) drug therapy: Secondary | ICD-10-CM | POA: Diagnosis not present

## 2021-01-20 DIAGNOSIS — I10 Essential (primary) hypertension: Secondary | ICD-10-CM | POA: Diagnosis not present

## 2021-01-20 DIAGNOSIS — E781 Pure hyperglyceridemia: Secondary | ICD-10-CM | POA: Diagnosis not present

## 2021-01-27 DIAGNOSIS — G4733 Obstructive sleep apnea (adult) (pediatric): Secondary | ICD-10-CM | POA: Diagnosis not present

## 2021-01-27 DIAGNOSIS — E781 Pure hyperglyceridemia: Secondary | ICD-10-CM | POA: Diagnosis not present

## 2021-01-27 DIAGNOSIS — R7303 Prediabetes: Secondary | ICD-10-CM | POA: Diagnosis not present

## 2021-01-27 DIAGNOSIS — I10 Essential (primary) hypertension: Secondary | ICD-10-CM | POA: Diagnosis not present

## 2021-05-19 DIAGNOSIS — E781 Pure hyperglyceridemia: Secondary | ICD-10-CM | POA: Diagnosis not present

## 2021-05-19 DIAGNOSIS — I10 Essential (primary) hypertension: Secondary | ICD-10-CM | POA: Diagnosis not present

## 2021-05-19 DIAGNOSIS — R7303 Prediabetes: Secondary | ICD-10-CM | POA: Diagnosis not present

## 2021-05-22 DIAGNOSIS — Z79899 Other long term (current) drug therapy: Secondary | ICD-10-CM | POA: Diagnosis not present

## 2021-05-22 DIAGNOSIS — F902 Attention-deficit hyperactivity disorder, combined type: Secondary | ICD-10-CM | POA: Diagnosis not present

## 2021-05-26 DIAGNOSIS — I10 Essential (primary) hypertension: Secondary | ICD-10-CM | POA: Diagnosis not present

## 2021-05-26 DIAGNOSIS — G4733 Obstructive sleep apnea (adult) (pediatric): Secondary | ICD-10-CM | POA: Diagnosis not present

## 2021-05-26 DIAGNOSIS — E119 Type 2 diabetes mellitus without complications: Secondary | ICD-10-CM | POA: Diagnosis not present

## 2021-05-26 DIAGNOSIS — E781 Pure hyperglyceridemia: Secondary | ICD-10-CM | POA: Diagnosis not present

## 2021-06-20 DIAGNOSIS — G4733 Obstructive sleep apnea (adult) (pediatric): Secondary | ICD-10-CM | POA: Diagnosis not present

## 2021-09-25 DIAGNOSIS — I10 Essential (primary) hypertension: Secondary | ICD-10-CM | POA: Diagnosis not present

## 2021-09-25 DIAGNOSIS — Z125 Encounter for screening for malignant neoplasm of prostate: Secondary | ICD-10-CM | POA: Diagnosis not present

## 2021-09-25 DIAGNOSIS — E119 Type 2 diabetes mellitus without complications: Secondary | ICD-10-CM | POA: Diagnosis not present

## 2021-09-25 DIAGNOSIS — E559 Vitamin D deficiency, unspecified: Secondary | ICD-10-CM | POA: Diagnosis not present

## 2021-09-25 DIAGNOSIS — E781 Pure hyperglyceridemia: Secondary | ICD-10-CM | POA: Diagnosis not present

## 2021-09-28 DIAGNOSIS — F902 Attention-deficit hyperactivity disorder, combined type: Secondary | ICD-10-CM | POA: Diagnosis not present

## 2021-09-28 DIAGNOSIS — Z79899 Other long term (current) drug therapy: Secondary | ICD-10-CM | POA: Diagnosis not present

## 2021-10-02 DIAGNOSIS — Z23 Encounter for immunization: Secondary | ICD-10-CM | POA: Diagnosis not present

## 2021-10-02 DIAGNOSIS — F988 Other specified behavioral and emotional disorders with onset usually occurring in childhood and adolescence: Secondary | ICD-10-CM | POA: Diagnosis not present

## 2021-10-02 DIAGNOSIS — I10 Essential (primary) hypertension: Secondary | ICD-10-CM | POA: Diagnosis not present

## 2021-10-02 DIAGNOSIS — Z0000 Encounter for general adult medical examination without abnormal findings: Secondary | ICD-10-CM | POA: Diagnosis not present

## 2021-10-02 DIAGNOSIS — E119 Type 2 diabetes mellitus without complications: Secondary | ICD-10-CM | POA: Diagnosis not present

## 2021-10-13 DIAGNOSIS — H6981 Other specified disorders of Eustachian tube, right ear: Secondary | ICD-10-CM | POA: Diagnosis not present

## 2021-10-13 DIAGNOSIS — H9042 Sensorineural hearing loss, unilateral, left ear, with unrestricted hearing on the contralateral side: Secondary | ICD-10-CM | POA: Diagnosis not present

## 2021-10-13 DIAGNOSIS — H9311 Tinnitus, right ear: Secondary | ICD-10-CM | POA: Diagnosis not present

## 2021-11-20 DIAGNOSIS — H9042 Sensorineural hearing loss, unilateral, left ear, with unrestricted hearing on the contralateral side: Secondary | ICD-10-CM | POA: Diagnosis not present

## 2021-11-20 DIAGNOSIS — J310 Chronic rhinitis: Secondary | ICD-10-CM | POA: Diagnosis not present

## 2021-11-20 DIAGNOSIS — H6983 Other specified disorders of Eustachian tube, bilateral: Secondary | ICD-10-CM | POA: Diagnosis not present

## 2021-11-20 DIAGNOSIS — H9311 Tinnitus, right ear: Secondary | ICD-10-CM | POA: Diagnosis not present

## 2021-11-23 DIAGNOSIS — H5213 Myopia, bilateral: Secondary | ICD-10-CM | POA: Diagnosis not present

## 2021-11-30 ENCOUNTER — Other Ambulatory Visit: Payer: Self-pay | Admitting: Otolaryngology

## 2021-11-30 DIAGNOSIS — H918X9 Other specified hearing loss, unspecified ear: Secondary | ICD-10-CM

## 2022-01-02 ENCOUNTER — Ambulatory Visit
Admission: RE | Admit: 2022-01-02 | Discharge: 2022-01-02 | Disposition: A | Discharge status: Home / Self Care | Payer: BC Managed Care – PPO | Source: Ambulatory Visit | Attending: Otolaryngology | Admitting: Otolaryngology

## 2022-01-02 DIAGNOSIS — H918X9 Other specified hearing loss, unspecified ear: Secondary | ICD-10-CM

## 2022-01-02 DIAGNOSIS — H9192 Unspecified hearing loss, left ear: Secondary | ICD-10-CM | POA: Diagnosis not present

## 2022-01-02 DIAGNOSIS — J341 Cyst and mucocele of nose and nasal sinus: Secondary | ICD-10-CM | POA: Diagnosis not present

## 2022-01-02 DIAGNOSIS — G930 Cerebral cysts: Secondary | ICD-10-CM | POA: Diagnosis not present

## 2022-01-02 MED ORDER — GADOBENATE DIMEGLUMINE 529 MG/ML IV SOLN
20.0000 mL | Freq: Once | INTRAVENOUS | Status: AC | PRN
  Administered 2022-01-02: 20 mL via INTRAVENOUS

## 2022-01-11 DIAGNOSIS — G4733 Obstructive sleep apnea (adult) (pediatric): Secondary | ICD-10-CM | POA: Diagnosis not present

## 2022-03-09 DIAGNOSIS — H9312 Tinnitus, left ear: Secondary | ICD-10-CM | POA: Diagnosis not present

## 2022-03-09 DIAGNOSIS — H9042 Sensorineural hearing loss, unilateral, left ear, with unrestricted hearing on the contralateral side: Secondary | ICD-10-CM | POA: Diagnosis not present

## 2022-03-29 DIAGNOSIS — G4733 Obstructive sleep apnea (adult) (pediatric): Secondary | ICD-10-CM | POA: Diagnosis not present

## 2022-03-29 DIAGNOSIS — F902 Attention-deficit hyperactivity disorder, combined type: Secondary | ICD-10-CM | POA: Diagnosis not present

## 2022-03-29 DIAGNOSIS — Z79899 Other long term (current) drug therapy: Secondary | ICD-10-CM | POA: Diagnosis not present

## 2022-04-02 DIAGNOSIS — E781 Pure hyperglyceridemia: Secondary | ICD-10-CM | POA: Diagnosis not present

## 2022-04-02 DIAGNOSIS — I10 Essential (primary) hypertension: Secondary | ICD-10-CM | POA: Diagnosis not present

## 2022-04-02 DIAGNOSIS — E119 Type 2 diabetes mellitus without complications: Secondary | ICD-10-CM | POA: Diagnosis not present

## 2022-04-09 DIAGNOSIS — G4733 Obstructive sleep apnea (adult) (pediatric): Secondary | ICD-10-CM | POA: Diagnosis not present

## 2022-04-09 DIAGNOSIS — E119 Type 2 diabetes mellitus without complications: Secondary | ICD-10-CM | POA: Diagnosis not present

## 2022-04-09 DIAGNOSIS — E781 Pure hyperglyceridemia: Secondary | ICD-10-CM | POA: Diagnosis not present

## 2022-05-10 DIAGNOSIS — E119 Type 2 diabetes mellitus without complications: Secondary | ICD-10-CM | POA: Diagnosis not present

## 2022-06-28 DIAGNOSIS — F900 Attention-deficit hyperactivity disorder, predominantly inattentive type: Secondary | ICD-10-CM | POA: Diagnosis not present

## 2022-07-02 DIAGNOSIS — F900 Attention-deficit hyperactivity disorder, predominantly inattentive type: Secondary | ICD-10-CM | POA: Diagnosis not present

## 2022-07-13 DIAGNOSIS — F900 Attention-deficit hyperactivity disorder, predominantly inattentive type: Secondary | ICD-10-CM | POA: Diagnosis not present

## 2022-07-20 DIAGNOSIS — F900 Attention-deficit hyperactivity disorder, predominantly inattentive type: Secondary | ICD-10-CM | POA: Diagnosis not present

## 2022-07-27 DIAGNOSIS — F900 Attention-deficit hyperactivity disorder, predominantly inattentive type: Secondary | ICD-10-CM | POA: Diagnosis not present

## 2022-08-08 DIAGNOSIS — F900 Attention-deficit hyperactivity disorder, predominantly inattentive type: Secondary | ICD-10-CM | POA: Diagnosis not present

## 2022-08-15 DIAGNOSIS — F900 Attention-deficit hyperactivity disorder, predominantly inattentive type: Secondary | ICD-10-CM | POA: Diagnosis not present

## 2022-08-24 DIAGNOSIS — F900 Attention-deficit hyperactivity disorder, predominantly inattentive type: Secondary | ICD-10-CM | POA: Diagnosis not present

## 2022-08-29 DIAGNOSIS — F900 Attention-deficit hyperactivity disorder, predominantly inattentive type: Secondary | ICD-10-CM | POA: Diagnosis not present

## 2022-09-05 DIAGNOSIS — F900 Attention-deficit hyperactivity disorder, predominantly inattentive type: Secondary | ICD-10-CM | POA: Diagnosis not present

## 2022-09-13 DIAGNOSIS — F902 Attention-deficit hyperactivity disorder, combined type: Secondary | ICD-10-CM | POA: Diagnosis not present

## 2022-09-13 DIAGNOSIS — Z79899 Other long term (current) drug therapy: Secondary | ICD-10-CM | POA: Diagnosis not present

## 2022-09-19 DIAGNOSIS — F900 Attention-deficit hyperactivity disorder, predominantly inattentive type: Secondary | ICD-10-CM | POA: Diagnosis not present

## 2022-10-01 DIAGNOSIS — E781 Pure hyperglyceridemia: Secondary | ICD-10-CM | POA: Diagnosis not present

## 2022-10-01 DIAGNOSIS — Z0000 Encounter for general adult medical examination without abnormal findings: Secondary | ICD-10-CM | POA: Diagnosis not present

## 2022-10-01 DIAGNOSIS — Z125 Encounter for screening for malignant neoplasm of prostate: Secondary | ICD-10-CM | POA: Diagnosis not present

## 2022-10-01 DIAGNOSIS — E559 Vitamin D deficiency, unspecified: Secondary | ICD-10-CM | POA: Diagnosis not present

## 2022-10-01 DIAGNOSIS — R7989 Other specified abnormal findings of blood chemistry: Secondary | ICD-10-CM | POA: Diagnosis not present

## 2022-10-01 DIAGNOSIS — E119 Type 2 diabetes mellitus without complications: Secondary | ICD-10-CM | POA: Diagnosis not present

## 2022-10-03 DIAGNOSIS — F900 Attention-deficit hyperactivity disorder, predominantly inattentive type: Secondary | ICD-10-CM | POA: Diagnosis not present

## 2022-10-08 DIAGNOSIS — Z0000 Encounter for general adult medical examination without abnormal findings: Secondary | ICD-10-CM | POA: Diagnosis not present

## 2022-10-08 DIAGNOSIS — E781 Pure hyperglyceridemia: Secondary | ICD-10-CM | POA: Diagnosis not present

## 2022-10-08 DIAGNOSIS — E119 Type 2 diabetes mellitus without complications: Secondary | ICD-10-CM | POA: Diagnosis not present

## 2022-10-08 DIAGNOSIS — Z23 Encounter for immunization: Secondary | ICD-10-CM | POA: Diagnosis not present

## 2022-10-17 DIAGNOSIS — F900 Attention-deficit hyperactivity disorder, predominantly inattentive type: Secondary | ICD-10-CM | POA: Diagnosis not present

## 2022-10-31 DIAGNOSIS — F900 Attention-deficit hyperactivity disorder, predominantly inattentive type: Secondary | ICD-10-CM | POA: Diagnosis not present

## 2022-11-22 DIAGNOSIS — G4733 Obstructive sleep apnea (adult) (pediatric): Secondary | ICD-10-CM | POA: Diagnosis not present

## 2022-11-23 DIAGNOSIS — H52223 Regular astigmatism, bilateral: Secondary | ICD-10-CM | POA: Diagnosis not present

## 2022-11-27 DIAGNOSIS — L309 Dermatitis, unspecified: Secondary | ICD-10-CM | POA: Diagnosis not present

## 2022-11-28 DIAGNOSIS — F900 Attention-deficit hyperactivity disorder, predominantly inattentive type: Secondary | ICD-10-CM | POA: Diagnosis not present

## 2022-12-12 DIAGNOSIS — F900 Attention-deficit hyperactivity disorder, predominantly inattentive type: Secondary | ICD-10-CM | POA: Diagnosis not present

## 2022-12-14 DIAGNOSIS — F902 Attention-deficit hyperactivity disorder, combined type: Secondary | ICD-10-CM | POA: Diagnosis not present

## 2022-12-14 DIAGNOSIS — Z79899 Other long term (current) drug therapy: Secondary | ICD-10-CM | POA: Diagnosis not present

## 2022-12-23 DIAGNOSIS — G4733 Obstructive sleep apnea (adult) (pediatric): Secondary | ICD-10-CM | POA: Diagnosis not present

## 2022-12-26 DIAGNOSIS — F900 Attention-deficit hyperactivity disorder, predominantly inattentive type: Secondary | ICD-10-CM | POA: Diagnosis not present

## 2022-12-29 IMAGING — MR MR BRAIN/TEMPORAL BONE/IAC
14 of 15 series · 45 of 48 positions shown · IV contrast (multihance)
Comparison: None.

CLINICAL DATA: Asymmetrical left-sided hearing loss. Intermittent
buzzing in the right ear for 2 years.

EXAM:
MRI HEAD WITHOUT AND WITH CONTRAST
TECHNIQUE: Multiplanar, multiecho pulse sequences of the brain and surrounding
structures were obtained without and with intravenous contrast.
CONTRAST:  20mL MULTIHANCE GADOBENATE DIMEGLUMINE 529 MG/ML IV SOLN

[Series 5: T1 · sagittal · 4.0mm · 0.72mm/px · 1 of 30 slices shown (1 of 3)]
[im 1/30]
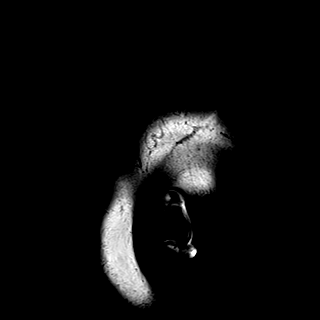

[Series 6: DWI · axial · 3.0mm · 0.94mm/px · z∈[-63,+93]mm · 9 of 180 slices shown]
[im 1/180]
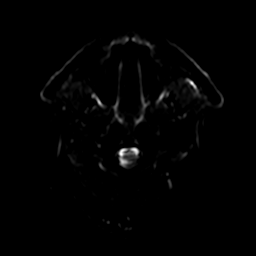
[im 23/180]
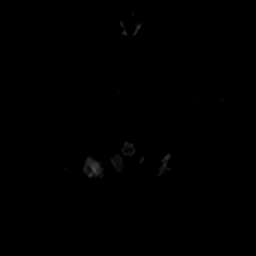
[im 45/180]
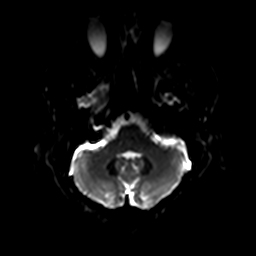
[im 68/180]
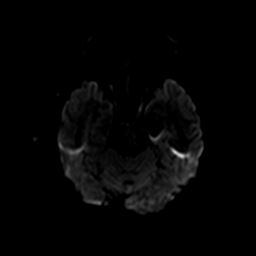
[im 90/180]
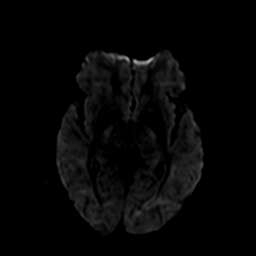
[im 112/180]
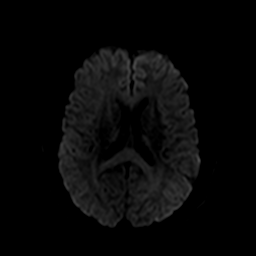
[im 135/180]
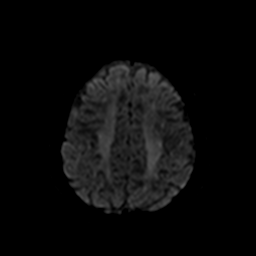
[im 157/180]
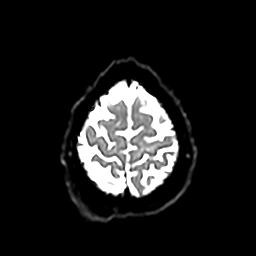
[im 180/180]
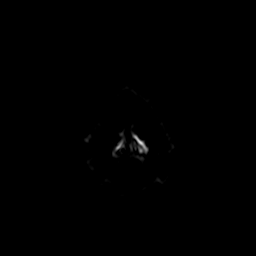

[Series 7: ax dwi_tracew · axial · 3.0mm · 0.94mm/px · z∈[-63,+93]mm · 5 of 90 slices shown]
[im 1/90]
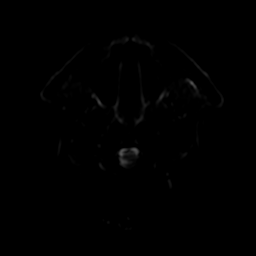
[im 23/90]
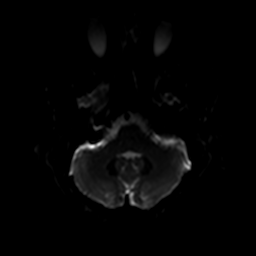
[im 45/90]
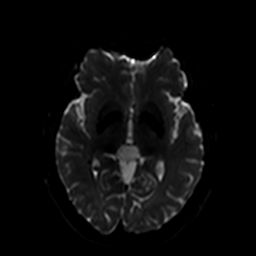
[im 67/90]
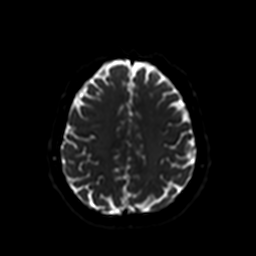
[im 90/90]
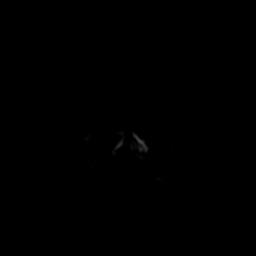

[Series 8: ax dwi_adc · axial · 3.0mm · 0.94mm/px · z∈[-63,+93]mm · 2 of 45 slices shown]
[im 1/45]
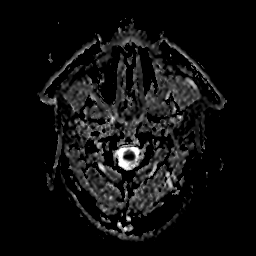
[im 45/45]
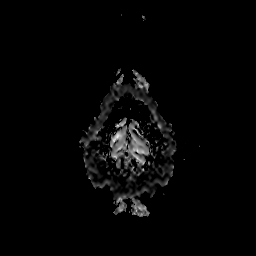

[Series 9: T2 · axial · 4.0mm · 0.36mm/px · z∈[-63,+92]mm · 2 of 31 slices shown (1 of 2)]
[im 1/31]
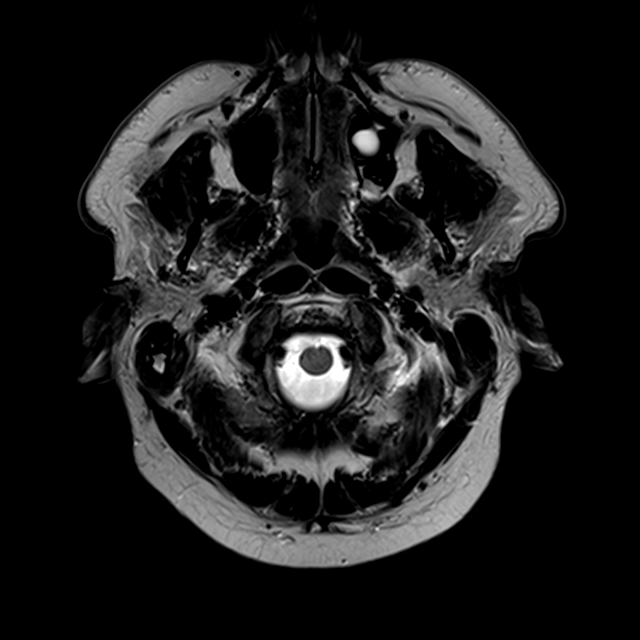
[im 31/31]
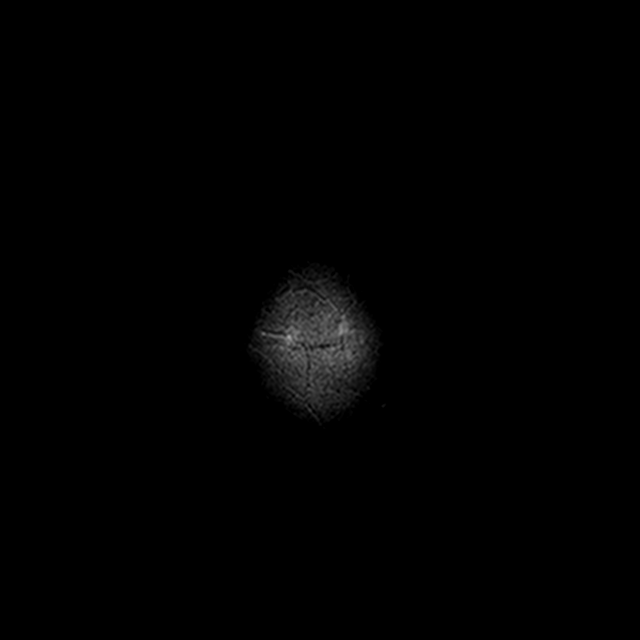

[Series 10: FLAIR · axial · 3.0mm · 0.72mm/px · z∈[-66,+95]mm · 2 of 28 slices shown]
[im 1/28]
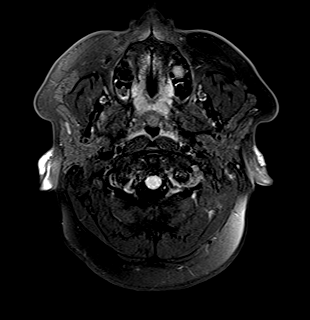
[im 28/28]
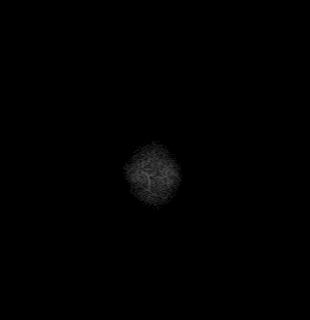

[Series 11: swi_images · axial · 1.5mm · 0.90mm/px · z∈[-59,+83]mm · 5 of 96 slices shown]
[im 1/96]
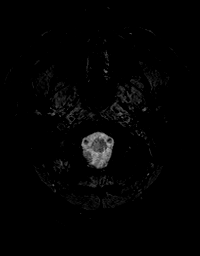
[im 24/96]
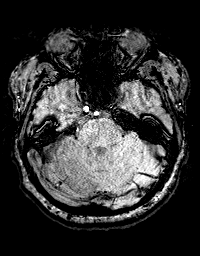
[im 48/96]
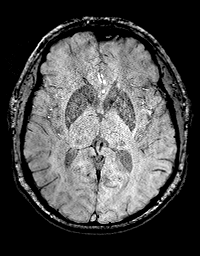
[im 72/96]
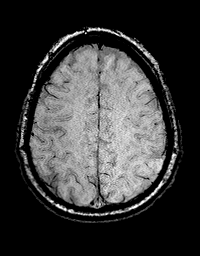
[im 96/96]
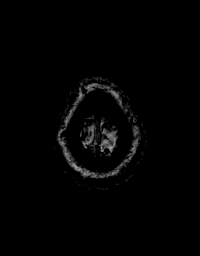

[Series 12: mip_images(sw) · axial · 12.0mm · 0.90mm/px · z∈[-53,+78]mm · 5 of 89 slices shown]
[im 1/89]
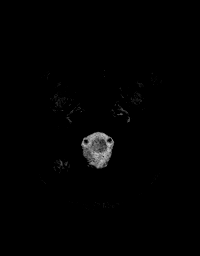
[im 23/89]
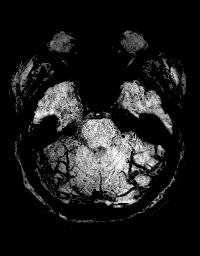
[im 45/89]
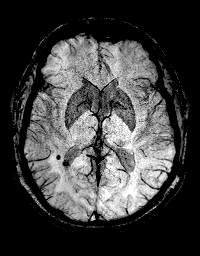
[im 67/89]
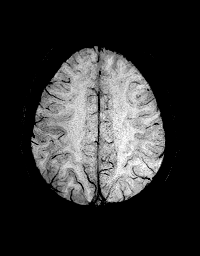
[im 89/89]
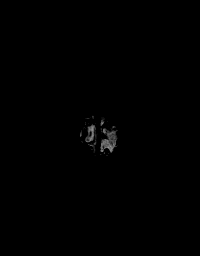

[Series 13: T1 · coronal · 3.0mm · 0.56mm/px · 1 of 13 slices shown (2 of 3)]
[im 1/13]
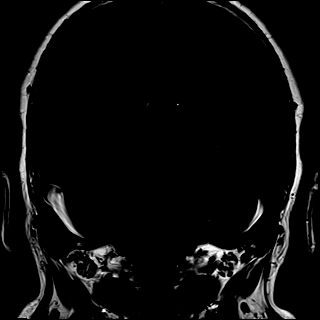

[Series 14: T2 · coronal · 3.0mm · 0.56mm/px · 1 of 13 slices shown (2 of 2)]
[im 1/13]
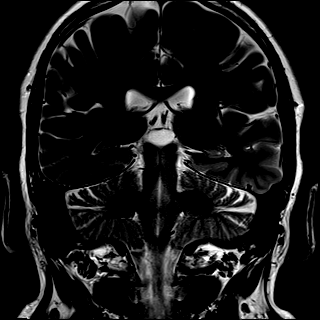

[Series 15: T1 · axial · 3.0mm · 0.50mm/px · 1 of 13 slices shown (3 of 3)]
[im 1/13]
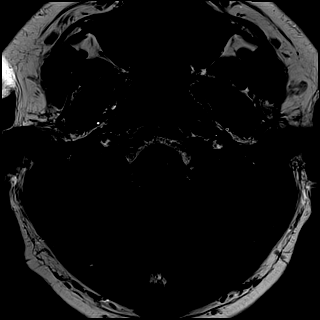

[Series 17: T1 post-contrast · coronal · 3.0mm · 0.56mm/px · 1 of 13 slices shown (1 of 3)]
[im 1/13]
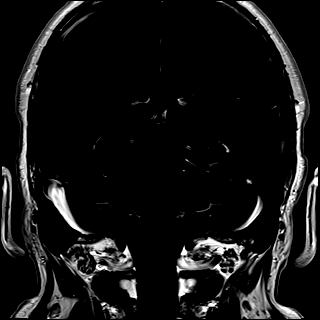

[Series 18: T1 post-contrast · axial · 3.0mm · 0.50mm/px · 1 of 13 slices shown (2 of 3)]
[im 1/13]
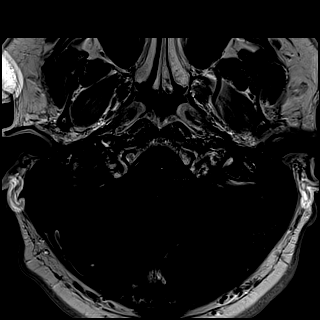

[Series 19: T1 post-contrast · axial · 1.0mm · 0.90mm/px · z∈[-65,+93]mm · 9 of 160 slices shown (3 of 3)]
[im 1/160]
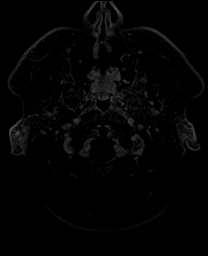
[im 20/160]
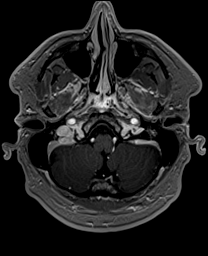
[im 40/160]
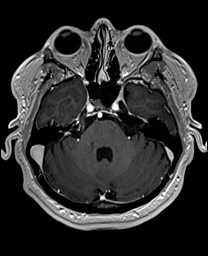
[im 60/160]
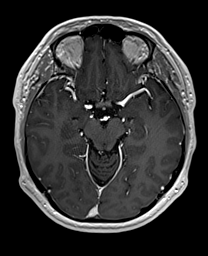
[im 80/160]
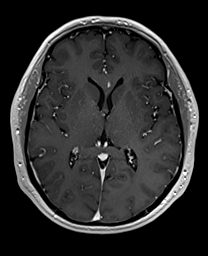
[im 100/160]
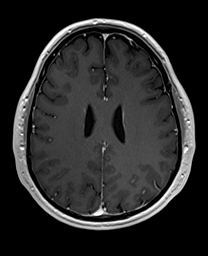
[im 120/160]
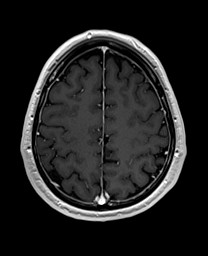
[im 140/160]
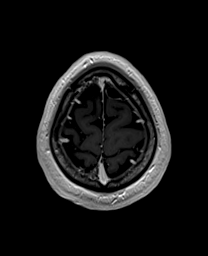
[im 160/160]
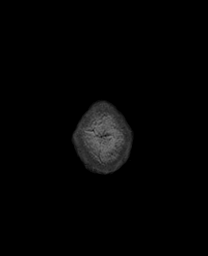

[45 of 48 positions shown; findings below may reference images not displayed]

FINDINGS: Brain: There is no evidence of an acute infarct, midline shift, or
extra-axial fluid collection. The ventricles and sulci are normal in
size. A simple appearing pineal cyst measures 1.7 cm in maximal
diameter without significant regional mass effect. No significant
white matter disease is seen. A small focus of susceptibility
artifact adjacent to the posterior aspect of the temporal horn of
the right lateral ventricle suggests a chronic microhemorrhage or
calcification. No abnormal enhancement is identified. There is a
partially empty sella.

Dedicated imaging through the internal auditory canals demonstrates
a normal course of cranial nerves VII and VIII without evidence of a
mass or abnormal enhancement. The inner ear structures demonstrate
normal signal and morphology bilaterally. No mass is present in the
cerebellopontine angles.

Vascular: Major intracranial vascular flow voids are preserved.

Skull and upper cervical spine: Unremarkable bone marrow signal.

Sinuses/Orbits: Unremarkable orbits. Small mucous retention cyst in
the left maxillary sinus. Trace right mastoid effusion.

Other: None.
IMPRESSION: 1. Negative internal auditory canal imaging. No retrocochlear
lesion.
2. Benign appearing pineal cyst.
3. No acute intracranial abnormality.

## 2023-01-23 DIAGNOSIS — F900 Attention-deficit hyperactivity disorder, predominantly inattentive type: Secondary | ICD-10-CM | POA: Diagnosis not present

## 2023-01-23 DIAGNOSIS — G4733 Obstructive sleep apnea (adult) (pediatric): Secondary | ICD-10-CM | POA: Diagnosis not present

## 2023-01-30 DIAGNOSIS — I10 Essential (primary) hypertension: Secondary | ICD-10-CM | POA: Diagnosis not present

## 2023-01-30 DIAGNOSIS — E119 Type 2 diabetes mellitus without complications: Secondary | ICD-10-CM | POA: Diagnosis not present

## 2023-01-30 DIAGNOSIS — E781 Pure hyperglyceridemia: Secondary | ICD-10-CM | POA: Diagnosis not present

## 2023-02-06 DIAGNOSIS — E119 Type 2 diabetes mellitus without complications: Secondary | ICD-10-CM | POA: Diagnosis not present

## 2023-02-06 DIAGNOSIS — Z23 Encounter for immunization: Secondary | ICD-10-CM | POA: Diagnosis not present

## 2023-02-06 DIAGNOSIS — F900 Attention-deficit hyperactivity disorder, predominantly inattentive type: Secondary | ICD-10-CM | POA: Diagnosis not present

## 2023-02-06 DIAGNOSIS — E781 Pure hyperglyceridemia: Secondary | ICD-10-CM | POA: Diagnosis not present

## 2023-02-06 DIAGNOSIS — I10 Essential (primary) hypertension: Secondary | ICD-10-CM | POA: Diagnosis not present

## 2023-02-20 DIAGNOSIS — F900 Attention-deficit hyperactivity disorder, predominantly inattentive type: Secondary | ICD-10-CM | POA: Diagnosis not present

## 2023-03-01 DIAGNOSIS — H9042 Sensorineural hearing loss, unilateral, left ear, with unrestricted hearing on the contralateral side: Secondary | ICD-10-CM | POA: Diagnosis not present

## 2023-03-02 DIAGNOSIS — J3489 Other specified disorders of nose and nasal sinuses: Secondary | ICD-10-CM | POA: Diagnosis not present

## 2023-03-02 DIAGNOSIS — R030 Elevated blood-pressure reading, without diagnosis of hypertension: Secondary | ICD-10-CM | POA: Diagnosis not present

## 2023-03-02 DIAGNOSIS — H6593 Unspecified nonsuppurative otitis media, bilateral: Secondary | ICD-10-CM | POA: Diagnosis not present

## 2023-03-02 DIAGNOSIS — B9689 Other specified bacterial agents as the cause of diseases classified elsewhere: Secondary | ICD-10-CM | POA: Diagnosis not present

## 2023-04-01 DIAGNOSIS — Z79899 Other long term (current) drug therapy: Secondary | ICD-10-CM | POA: Diagnosis not present

## 2023-04-01 DIAGNOSIS — Z79891 Long term (current) use of opiate analgesic: Secondary | ICD-10-CM | POA: Diagnosis not present

## 2023-04-01 DIAGNOSIS — Z5181 Encounter for therapeutic drug level monitoring: Secondary | ICD-10-CM | POA: Diagnosis not present

## 2023-04-01 DIAGNOSIS — F902 Attention-deficit hyperactivity disorder, combined type: Secondary | ICD-10-CM | POA: Diagnosis not present

## 2023-04-15 DIAGNOSIS — M25551 Pain in right hip: Secondary | ICD-10-CM | POA: Diagnosis not present

## 2023-04-15 DIAGNOSIS — M6281 Muscle weakness (generalized): Secondary | ICD-10-CM | POA: Diagnosis not present

## 2023-04-15 DIAGNOSIS — M5459 Other low back pain: Secondary | ICD-10-CM | POA: Diagnosis not present

## 2023-04-15 DIAGNOSIS — M25552 Pain in left hip: Secondary | ICD-10-CM | POA: Diagnosis not present

## 2023-04-18 DIAGNOSIS — M5459 Other low back pain: Secondary | ICD-10-CM | POA: Diagnosis not present

## 2023-04-18 DIAGNOSIS — M6281 Muscle weakness (generalized): Secondary | ICD-10-CM | POA: Diagnosis not present

## 2023-04-18 DIAGNOSIS — M25552 Pain in left hip: Secondary | ICD-10-CM | POA: Diagnosis not present

## 2023-04-18 DIAGNOSIS — M25551 Pain in right hip: Secondary | ICD-10-CM | POA: Diagnosis not present

## 2023-04-23 DIAGNOSIS — M25552 Pain in left hip: Secondary | ICD-10-CM | POA: Diagnosis not present

## 2023-04-23 DIAGNOSIS — M25551 Pain in right hip: Secondary | ICD-10-CM | POA: Diagnosis not present

## 2023-04-23 DIAGNOSIS — M5459 Other low back pain: Secondary | ICD-10-CM | POA: Diagnosis not present

## 2023-04-23 DIAGNOSIS — M6281 Muscle weakness (generalized): Secondary | ICD-10-CM | POA: Diagnosis not present

## 2023-04-26 DIAGNOSIS — M5459 Other low back pain: Secondary | ICD-10-CM | POA: Diagnosis not present

## 2023-04-26 DIAGNOSIS — M6281 Muscle weakness (generalized): Secondary | ICD-10-CM | POA: Diagnosis not present

## 2023-04-26 DIAGNOSIS — M25551 Pain in right hip: Secondary | ICD-10-CM | POA: Diagnosis not present

## 2023-04-26 DIAGNOSIS — M25552 Pain in left hip: Secondary | ICD-10-CM | POA: Diagnosis not present

## 2023-05-02 DIAGNOSIS — M5459 Other low back pain: Secondary | ICD-10-CM | POA: Diagnosis not present

## 2023-05-02 DIAGNOSIS — M25551 Pain in right hip: Secondary | ICD-10-CM | POA: Diagnosis not present

## 2023-05-02 DIAGNOSIS — M6281 Muscle weakness (generalized): Secondary | ICD-10-CM | POA: Diagnosis not present

## 2023-05-02 DIAGNOSIS — M25552 Pain in left hip: Secondary | ICD-10-CM | POA: Diagnosis not present

## 2023-05-08 DIAGNOSIS — M5459 Other low back pain: Secondary | ICD-10-CM | POA: Diagnosis not present

## 2023-05-08 DIAGNOSIS — M6281 Muscle weakness (generalized): Secondary | ICD-10-CM | POA: Diagnosis not present

## 2023-05-08 DIAGNOSIS — M25551 Pain in right hip: Secondary | ICD-10-CM | POA: Diagnosis not present

## 2023-05-08 DIAGNOSIS — M25552 Pain in left hip: Secondary | ICD-10-CM | POA: Diagnosis not present

## 2023-05-14 DIAGNOSIS — M6281 Muscle weakness (generalized): Secondary | ICD-10-CM | POA: Diagnosis not present

## 2023-05-14 DIAGNOSIS — M25551 Pain in right hip: Secondary | ICD-10-CM | POA: Diagnosis not present

## 2023-05-14 DIAGNOSIS — M5459 Other low back pain: Secondary | ICD-10-CM | POA: Diagnosis not present

## 2023-05-14 DIAGNOSIS — M25552 Pain in left hip: Secondary | ICD-10-CM | POA: Diagnosis not present

## 2023-05-16 DIAGNOSIS — M25552 Pain in left hip: Secondary | ICD-10-CM | POA: Diagnosis not present

## 2023-05-16 DIAGNOSIS — M6281 Muscle weakness (generalized): Secondary | ICD-10-CM | POA: Diagnosis not present

## 2023-05-16 DIAGNOSIS — M5459 Other low back pain: Secondary | ICD-10-CM | POA: Diagnosis not present

## 2023-05-16 DIAGNOSIS — M25551 Pain in right hip: Secondary | ICD-10-CM | POA: Diagnosis not present

## 2023-05-21 DIAGNOSIS — M5459 Other low back pain: Secondary | ICD-10-CM | POA: Diagnosis not present

## 2023-05-21 DIAGNOSIS — M25552 Pain in left hip: Secondary | ICD-10-CM | POA: Diagnosis not present

## 2023-05-21 DIAGNOSIS — M6281 Muscle weakness (generalized): Secondary | ICD-10-CM | POA: Diagnosis not present

## 2023-05-21 DIAGNOSIS — M25551 Pain in right hip: Secondary | ICD-10-CM | POA: Diagnosis not present

## 2023-05-23 DIAGNOSIS — M25551 Pain in right hip: Secondary | ICD-10-CM | POA: Diagnosis not present

## 2023-05-23 DIAGNOSIS — M6281 Muscle weakness (generalized): Secondary | ICD-10-CM | POA: Diagnosis not present

## 2023-05-23 DIAGNOSIS — M5459 Other low back pain: Secondary | ICD-10-CM | POA: Diagnosis not present

## 2023-05-23 DIAGNOSIS — M25552 Pain in left hip: Secondary | ICD-10-CM | POA: Diagnosis not present

## 2023-05-28 DIAGNOSIS — M25551 Pain in right hip: Secondary | ICD-10-CM | POA: Diagnosis not present

## 2023-05-28 DIAGNOSIS — M5459 Other low back pain: Secondary | ICD-10-CM | POA: Diagnosis not present

## 2023-05-28 DIAGNOSIS — M6281 Muscle weakness (generalized): Secondary | ICD-10-CM | POA: Diagnosis not present

## 2023-05-28 DIAGNOSIS — M25552 Pain in left hip: Secondary | ICD-10-CM | POA: Diagnosis not present

## 2023-05-31 DIAGNOSIS — I10 Essential (primary) hypertension: Secondary | ICD-10-CM | POA: Diagnosis not present

## 2023-05-31 DIAGNOSIS — E781 Pure hyperglyceridemia: Secondary | ICD-10-CM | POA: Diagnosis not present

## 2023-05-31 DIAGNOSIS — E119 Type 2 diabetes mellitus without complications: Secondary | ICD-10-CM | POA: Diagnosis not present

## 2023-05-31 DIAGNOSIS — E559 Vitamin D deficiency, unspecified: Secondary | ICD-10-CM | POA: Diagnosis not present

## 2023-06-04 DIAGNOSIS — M5459 Other low back pain: Secondary | ICD-10-CM | POA: Diagnosis not present

## 2023-06-04 DIAGNOSIS — M25552 Pain in left hip: Secondary | ICD-10-CM | POA: Diagnosis not present

## 2023-06-04 DIAGNOSIS — M6281 Muscle weakness (generalized): Secondary | ICD-10-CM | POA: Diagnosis not present

## 2023-06-04 DIAGNOSIS — M25551 Pain in right hip: Secondary | ICD-10-CM | POA: Diagnosis not present

## 2023-06-06 DIAGNOSIS — M6281 Muscle weakness (generalized): Secondary | ICD-10-CM | POA: Diagnosis not present

## 2023-06-06 DIAGNOSIS — M5459 Other low back pain: Secondary | ICD-10-CM | POA: Diagnosis not present

## 2023-06-06 DIAGNOSIS — M25551 Pain in right hip: Secondary | ICD-10-CM | POA: Diagnosis not present

## 2023-06-06 DIAGNOSIS — M25552 Pain in left hip: Secondary | ICD-10-CM | POA: Diagnosis not present

## 2023-06-07 DIAGNOSIS — E559 Vitamin D deficiency, unspecified: Secondary | ICD-10-CM | POA: Diagnosis not present

## 2023-06-07 DIAGNOSIS — E781 Pure hyperglyceridemia: Secondary | ICD-10-CM | POA: Diagnosis not present

## 2023-06-07 DIAGNOSIS — E119 Type 2 diabetes mellitus without complications: Secondary | ICD-10-CM | POA: Diagnosis not present

## 2023-06-07 DIAGNOSIS — I10 Essential (primary) hypertension: Secondary | ICD-10-CM | POA: Diagnosis not present

## 2023-06-11 DIAGNOSIS — M25552 Pain in left hip: Secondary | ICD-10-CM | POA: Diagnosis not present

## 2023-06-11 DIAGNOSIS — M6281 Muscle weakness (generalized): Secondary | ICD-10-CM | POA: Diagnosis not present

## 2023-06-11 DIAGNOSIS — M5459 Other low back pain: Secondary | ICD-10-CM | POA: Diagnosis not present

## 2023-06-11 DIAGNOSIS — M25551 Pain in right hip: Secondary | ICD-10-CM | POA: Diagnosis not present

## 2023-06-18 DIAGNOSIS — M5459 Other low back pain: Secondary | ICD-10-CM | POA: Diagnosis not present

## 2023-06-18 DIAGNOSIS — M25551 Pain in right hip: Secondary | ICD-10-CM | POA: Diagnosis not present

## 2023-06-18 DIAGNOSIS — M25552 Pain in left hip: Secondary | ICD-10-CM | POA: Diagnosis not present

## 2023-06-18 DIAGNOSIS — M6281 Muscle weakness (generalized): Secondary | ICD-10-CM | POA: Diagnosis not present

## 2023-06-21 DIAGNOSIS — M25551 Pain in right hip: Secondary | ICD-10-CM | POA: Diagnosis not present

## 2023-06-21 DIAGNOSIS — M6281 Muscle weakness (generalized): Secondary | ICD-10-CM | POA: Diagnosis not present

## 2023-06-21 DIAGNOSIS — M25552 Pain in left hip: Secondary | ICD-10-CM | POA: Diagnosis not present

## 2023-06-21 DIAGNOSIS — M5459 Other low back pain: Secondary | ICD-10-CM | POA: Diagnosis not present

## 2023-06-28 DIAGNOSIS — Z79899 Other long term (current) drug therapy: Secondary | ICD-10-CM | POA: Diagnosis not present

## 2023-06-28 DIAGNOSIS — F902 Attention-deficit hyperactivity disorder, combined type: Secondary | ICD-10-CM | POA: Diagnosis not present

## 2023-07-02 DIAGNOSIS — M5459 Other low back pain: Secondary | ICD-10-CM | POA: Diagnosis not present

## 2023-07-02 DIAGNOSIS — M6281 Muscle weakness (generalized): Secondary | ICD-10-CM | POA: Diagnosis not present

## 2023-07-02 DIAGNOSIS — M25552 Pain in left hip: Secondary | ICD-10-CM | POA: Diagnosis not present

## 2023-07-02 DIAGNOSIS — M25551 Pain in right hip: Secondary | ICD-10-CM | POA: Diagnosis not present

## 2023-07-17 DIAGNOSIS — M25552 Pain in left hip: Secondary | ICD-10-CM | POA: Diagnosis not present

## 2023-07-17 DIAGNOSIS — M25551 Pain in right hip: Secondary | ICD-10-CM | POA: Diagnosis not present

## 2023-07-17 DIAGNOSIS — M6281 Muscle weakness (generalized): Secondary | ICD-10-CM | POA: Diagnosis not present

## 2023-07-17 DIAGNOSIS — M5459 Other low back pain: Secondary | ICD-10-CM | POA: Diagnosis not present

## 2023-07-23 DIAGNOSIS — M25551 Pain in right hip: Secondary | ICD-10-CM | POA: Diagnosis not present

## 2023-07-23 DIAGNOSIS — M6281 Muscle weakness (generalized): Secondary | ICD-10-CM | POA: Diagnosis not present

## 2023-07-23 DIAGNOSIS — M25552 Pain in left hip: Secondary | ICD-10-CM | POA: Diagnosis not present

## 2023-07-23 DIAGNOSIS — M5459 Other low back pain: Secondary | ICD-10-CM | POA: Diagnosis not present

## 2023-07-25 DIAGNOSIS — M5459 Other low back pain: Secondary | ICD-10-CM | POA: Diagnosis not present

## 2023-07-25 DIAGNOSIS — M6281 Muscle weakness (generalized): Secondary | ICD-10-CM | POA: Diagnosis not present

## 2023-07-25 DIAGNOSIS — M25551 Pain in right hip: Secondary | ICD-10-CM | POA: Diagnosis not present

## 2023-07-25 DIAGNOSIS — M25552 Pain in left hip: Secondary | ICD-10-CM | POA: Diagnosis not present

## 2023-08-05 DIAGNOSIS — M6281 Muscle weakness (generalized): Secondary | ICD-10-CM | POA: Diagnosis not present

## 2023-08-05 DIAGNOSIS — M25552 Pain in left hip: Secondary | ICD-10-CM | POA: Diagnosis not present

## 2023-08-05 DIAGNOSIS — M25551 Pain in right hip: Secondary | ICD-10-CM | POA: Diagnosis not present

## 2023-08-05 DIAGNOSIS — M5459 Other low back pain: Secondary | ICD-10-CM | POA: Diagnosis not present

## 2023-08-13 DIAGNOSIS — M25551 Pain in right hip: Secondary | ICD-10-CM | POA: Diagnosis not present

## 2023-08-13 DIAGNOSIS — M5459 Other low back pain: Secondary | ICD-10-CM | POA: Diagnosis not present

## 2023-08-13 DIAGNOSIS — M25552 Pain in left hip: Secondary | ICD-10-CM | POA: Diagnosis not present

## 2023-08-13 DIAGNOSIS — M6281 Muscle weakness (generalized): Secondary | ICD-10-CM | POA: Diagnosis not present

## 2023-08-15 DIAGNOSIS — M5459 Other low back pain: Secondary | ICD-10-CM | POA: Diagnosis not present

## 2023-08-15 DIAGNOSIS — M25551 Pain in right hip: Secondary | ICD-10-CM | POA: Diagnosis not present

## 2023-08-15 DIAGNOSIS — M6281 Muscle weakness (generalized): Secondary | ICD-10-CM | POA: Diagnosis not present

## 2023-08-15 DIAGNOSIS — M25552 Pain in left hip: Secondary | ICD-10-CM | POA: Diagnosis not present

## 2023-09-28 DIAGNOSIS — G4733 Obstructive sleep apnea (adult) (pediatric): Secondary | ICD-10-CM | POA: Diagnosis not present

## 2023-10-11 DIAGNOSIS — E119 Type 2 diabetes mellitus without complications: Secondary | ICD-10-CM | POA: Diagnosis not present

## 2023-10-11 DIAGNOSIS — Z125 Encounter for screening for malignant neoplasm of prostate: Secondary | ICD-10-CM | POA: Diagnosis not present

## 2023-10-11 DIAGNOSIS — I10 Essential (primary) hypertension: Secondary | ICD-10-CM | POA: Diagnosis not present

## 2023-10-11 DIAGNOSIS — E781 Pure hyperglyceridemia: Secondary | ICD-10-CM | POA: Diagnosis not present

## 2023-10-11 DIAGNOSIS — R7989 Other specified abnormal findings of blood chemistry: Secondary | ICD-10-CM | POA: Diagnosis not present

## 2023-10-18 DIAGNOSIS — F39 Unspecified mood [affective] disorder: Secondary | ICD-10-CM | POA: Diagnosis not present

## 2023-10-18 DIAGNOSIS — H6122 Impacted cerumen, left ear: Secondary | ICD-10-CM | POA: Diagnosis not present

## 2023-10-18 DIAGNOSIS — I10 Essential (primary) hypertension: Secondary | ICD-10-CM | POA: Diagnosis not present

## 2023-10-18 DIAGNOSIS — Z23 Encounter for immunization: Secondary | ICD-10-CM | POA: Diagnosis not present

## 2023-10-18 DIAGNOSIS — Z0000 Encounter for general adult medical examination without abnormal findings: Secondary | ICD-10-CM | POA: Diagnosis not present

## 2023-10-18 DIAGNOSIS — E119 Type 2 diabetes mellitus without complications: Secondary | ICD-10-CM | POA: Diagnosis not present

## 2023-10-29 DIAGNOSIS — G4733 Obstructive sleep apnea (adult) (pediatric): Secondary | ICD-10-CM | POA: Diagnosis not present

## 2023-11-21 DIAGNOSIS — H52223 Regular astigmatism, bilateral: Secondary | ICD-10-CM | POA: Diagnosis not present

## 2023-11-28 DIAGNOSIS — G4733 Obstructive sleep apnea (adult) (pediatric): Secondary | ICD-10-CM | POA: Diagnosis not present

## 2023-12-02 DIAGNOSIS — G4733 Obstructive sleep apnea (adult) (pediatric): Secondary | ICD-10-CM | POA: Diagnosis not present

## 2024-05-07 ENCOUNTER — Other Ambulatory Visit: Payer: Self-pay | Admitting: Surgery

## 2024-05-07 DIAGNOSIS — R7989 Other specified abnormal findings of blood chemistry: Secondary | ICD-10-CM

## 2024-05-10 ENCOUNTER — Ambulatory Visit
Admission: RE | Admit: 2024-05-10 | Discharge: 2024-05-10 | Disposition: A | Discharge status: Home / Self Care | Source: Ambulatory Visit | Attending: Surgery | Admitting: Surgery

## 2024-05-10 DIAGNOSIS — R7989 Other specified abnormal findings of blood chemistry: Secondary | ICD-10-CM

## 2024-05-10 DIAGNOSIS — K8020 Calculus of gallbladder without cholecystitis without obstruction: Secondary | ICD-10-CM

## 2024-05-10 HISTORY — DX: Calculus of gallbladder without cholecystitis without obstruction: K80.20

## 2024-05-10 MED ORDER — GADOPICLENOL 0.5 MMOL/ML IV SOLN
10.0000 mL | Freq: Once | INTRAVENOUS | Status: AC | PRN
  Administered 2024-05-10: 10 mL via INTRAVENOUS

## 2024-06-02 ENCOUNTER — Ambulatory Visit (INDEPENDENT_AMBULATORY_CARE_PROVIDER_SITE_OTHER): Admitting: Otolaryngology

## 2024-06-22 ENCOUNTER — Encounter (INDEPENDENT_AMBULATORY_CARE_PROVIDER_SITE_OTHER): Payer: Self-pay | Admitting: Otolaryngology

## 2024-06-22 ENCOUNTER — Ambulatory Visit (INDEPENDENT_AMBULATORY_CARE_PROVIDER_SITE_OTHER): Admitting: Otolaryngology

## 2024-06-22 VITALS — BP 139/82 | HR 82

## 2024-06-22 DIAGNOSIS — H6122 Impacted cerumen, left ear: Secondary | ICD-10-CM

## 2024-06-22 DIAGNOSIS — H9312 Tinnitus, left ear: Secondary | ICD-10-CM | POA: Diagnosis not present

## 2024-06-22 DIAGNOSIS — H9042 Sensorineural hearing loss, unilateral, left ear, with unrestricted hearing on the contralateral side: Secondary | ICD-10-CM | POA: Diagnosis not present

## 2024-06-22 NOTE — Progress Notes (Unsigned)
 Patient ID: Zachary Herman, male   DOB: 1971/11/03, 53 y.o.   MRN: [id number]  Follow-up: Right ear tinnitus, left ear asymmetric hearing loss

## 2024-06-23 DIAGNOSIS — H6122 Impacted cerumen, left ear: Secondary | ICD-10-CM | POA: Insufficient documentation

## 2024-06-23 DIAGNOSIS — H9312 Tinnitus, left ear: Secondary | ICD-10-CM | POA: Insufficient documentation

## 2024-06-23 DIAGNOSIS — H9042 Sensorineural hearing loss, unilateral, left ear, with unrestricted hearing on the contralateral side: Secondary | ICD-10-CM | POA: Insufficient documentation

## 2024-07-20 ENCOUNTER — Encounter: Payer: Self-pay | Admitting: Physician Assistant

## 2024-09-09 NOTE — Progress Notes (Unsigned)
 [name], PA-C [address] [place], [place]  [address] Phone: [phone]   Gastroenterology Consultation  Referring Provider:     Gerome Brunet, DO Primary Care Physician:  Gerome Brunet, DO Primary Gastroenterologist:  [name], PA-C / Dr. Gordy Starch  Reason for Consultation:     Elevated liver enzymes        HPI:   Zachary Herman is a 53 y.o. y/o male referred for consultation & management  by Gerome Brunet, DO.    Previous patient Dr. Teressa.  Here to evaluate elevated liver enzymes.  Patient saw general surgeon Dr. Dasie at [facility] 05/06/2024 for evaluation of elevated LFTs and gallstones.  He had routine labwork done 04/2024, which showed mildly elevated transaminases (AST/ALT 63/145). His bilirubin and alk phos were normal. He then had repeat labs on 5/8, which showed increase in his AST/ALT to 144/362. His alk phos and Tbili remained normal. He was referred for a RUQ US , which per report showed cholelithiasis and sludge with adenomyomatosis, as well as hepatic steatosis. He was referred to discuss cholecystectomy. He says he has had some nausea, but no epigastric or RUQ abdominal pain. He has had diffuse itching at night. He was sick in April with a viral illness and diffuse aches at that time, but otherwise no abdominal pain.  He was previously on Mounjaro, but this was discontinued due to his elevated LFTs. He says he did have nausea while on Mounjaro. He has not had any previous abdominal surgeries.  Dr. Dasie did not think his elevated LFTs were due to gallstones and she did not recommend cholecystectomy.  She recommended follow-up with GI for further evaluation.  CHEM PROFILE  [facility]06/04/2024 Component 05/14/2024 05/06/2024      Protein Total - [facility] 6.5 6.5  Albumin - [facility] 4.0 4.1  Bilirubin Total - [facility] 0.5 1.4 High     Bilirubin, Direct - [facility] 0.37 0.89 High     Alkaline Phosphatase - [facility] 235 High    223 High     AST  (SGOT) - [facility] 342 High    779 High Off-Scale      ALT (SGPT) - [facility] 881 High Off-Scale     1,527 High Off-Scale        05/10/2024 abdominal MRI/MRCP: 1. Hepatomegaly. No focal liver lesion or biliary ductal dilatation. 2. Sludge and tiny gallstones. No gallbladder wall thickening, or biliary dilatation. 3. Mild splenomegaly.  Current symptoms: Patient has no current GI symptoms.  Specifically, he denies abdominal pain, jaundice, itching, abdominal or extremity swelling.  He denies family history of liver disease.  He has not drank any alcohol since May 2025.  Prior to that, he rarely drink alcohol.  He stopped taking Mounjaro 04/2024.  He restarted taking Mounjaro 06/2024, currently on 5 mg dose.  Patient had mono many years ago.  Does not recall if he had hepatitis A or B vaccines.  Patient states he felt acutely ill with viral symptoms in April before his liver test became elevated.  His son was also sick at the time.  Patient felt achy, headache, constipated, and had a rash with itching and hives.  Although symptoms resolved.  GI history: - 05/2017 colonoscopy by Dr. Teressa (for generalized abdominal pain): Normal.  No polyps.  10-year repeat for screening. - 05/2017 EGD by Dr. Teressa: Small hiatal hernia, otherwise normal.  No biopsies.   Past Medical History:  Diagnosis Date   Adult ADHD  Allergic rhinitis    Esophageal reflux    Gallstones 05/2024   MRCP   High triglycerides    Hyperlipidemia, mild    On fish oil.   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 Authorizing  Provider  AMBULATORY NON FORMULARY MEDICATION Diltiazem Gel ointment 2% Apply two times daily every and then for 1 month after your pain is gone.  Please use up to first knuckle. 12/27/20   Teressa Toribio SQUIBB, MD  amLODipine (NORVASC) 10 MG tablet Take 10 mg by mouth daily. 08/01/20   [provider]  b complex vitamins tablet Take 1 tablet by mouth daily.    [provider]  cholecalciferol (VITAMIN D3) 25 MCG (1000 UT) tablet Take 1,000 Units by mouth daily.    [provider]  ciprofloxacin (CILOXAN) 0.3 % ophthalmic solution Not using currently 08/14/19   [provider]  co-enzyme Q-10 30 MG capsule Take 30 mg by mouth 3 (three) times daily.    [provider]  cyclobenzaprine (FLEXERIL) 10 MG tablet Take 10 mg by mouth as needed.  06/22/20   [provider]  ferrous sulfate 325 (65 FE) MG EC tablet Take 325 mg by mouth 3 (three) times daily with meals.    [provider]  magnesium 30 MG tablet Take 30 mg by mouth 2 (two) times daily.    [provider]  Omega-3 Fatty Acids (FISH OIL) 1000 MG CAPS Take by mouth.    [provider]  prednisoLONE acetate (PRED FORTE) 1 % ophthalmic suspension Not using currently 08/14/19   [provider]  valsartan (DIOVAN) 320 MG tablet Take 320 mg by mouth daily. 08/01/20   [provider]  VYVANSE 60 MG capsule Take 60 mg by mouth daily. 06/24/20   [provider]    Family History  Problem Relation Age of Onset   Healthy Mother        Living, 102   Lymphoma Father    Diabetes Father    Hypertension Father    Stroke Father    Heart disease Father    Heart attack Father    Healthy Brother    Diabetes Paternal Grandfather    Healthy Son    Breast cancer Maternal Aunt    Breast cancer Paternal Aunt    Colon cancer Neg Hx    Esophageal cancer Neg Hx      Social History   Tobacco Use   Smoking status: Former    Current packs/day: 0.00    Types:  Cigarettes    Quit date: 01/23/1998    Years since quitting: 26.6   Smokeless tobacco: Never   Tobacco comments:    Quit 18+ years  Vaping Use   Vaping status: Never Used  Substance Use Topics   Alcohol use: Yes    Alcohol/week: 3.0 standard drinks of alcohol    Types: 3 Standard drinks or equivalent per week    Comment: social wine, 1 glass about 3-4 times per week  Drug use: No    Comment: Previously meth, LSD, marijuana     Allergies as of 09/10/2024 - Review Complete 09/10/2024  Allergen Reaction Noted   Tetracyclines & related  10/25/2014    Review of Systems:    All systems reviewed and negative except where noted in HPI.   Physical Exam:  BP 138/82   Pulse 93   Ht 5' 7.75 (1.721 m)   Wt 222 lb (100.7 kg)   BMI 34.00 kg/m  No LMP for male patient.  General:   Alert,  Well-developed, well-nourished, pleasant and cooperative in NAD Lungs:  Respirations even and unlabored.  Clear throughout to auscultation.   No wheezes, crackles, or rhonchi. No acute distress. Heart:  Regular rate and rhythm; no murmurs, clicks, rubs, or gallops. Abdomen:  Normal bowel sounds.  No bruits.  Soft, and non-distended without masses, hepatosplenomegaly or hernias noted.  No Tenderness.  No guarding or rebound tenderness.    Neurologic:  Alert and oriented x3;  grossly normal neurologically. Psych:  Alert and cooperative. Normal mood and affect.  Imaging Studies: No results found.  Labs: CBC    Component Value Date/Time   WBC 9.1 03/06/2017 1610   RBC 5.84 (H) 03/06/2017 1610   HGB 14.9 03/06/2017 1610   HCT 44.0 03/06/2017 1610   PLT 302.0 03/06/2017 1610   MCV 75.4 (L) 03/06/2017 1610    CMP     Component Value Date/Time   NA 138 03/06/2017 1610   K 4.5 03/06/2017 1610   CL 101 03/06/2017 1610   CO2 31 03/06/2017 1610   GLUCOSE 101 (H) 03/06/2017 1610   BUN 14 03/06/2017 1610   CREATININE 0.94 03/06/2017 1610   CALCIUM 9.6 03/06/2017 1610   PROT 7.3 09/10/2024 1042    ALBUMIN 4.5 09/10/2024 1042   AST 52 (H) 09/10/2024 1042   ALT 107 (H) 09/10/2024 1042   ALKPHOS 67 09/10/2024 1042   BILITOT 0.5 09/10/2024 1042    Assessment and Plan:   Indy Kuck is a 53 y.o. y/o male has been referred for evaluation of acute elevated LFTs, liver transaminases and alkaline phosphatase.  RUQ ultrasound and abdominal MRI/MRCP showed gallbladder sludge and tiny gallstones, however no evidence of choledocholithiasis.  He has not had any significant RUQ pain.  Differential for elevated LFTs includes, but is not limited to, drug-induced liver injury, viral infection, autoimmune hepatitis, PBC, hemochromatosis.  I am ordering more labs for follow-up evaluation.  Patient is currently asymptomatic.  Plan: - Continue to avoid all alcohol - Continue to avoid all OTC herbal supplements and medications - Labs: Hepatic panel, ANA, AMA, ASMA, ceruloplasmin, viral hepatitis A/B/C, iron panel, ferritin, immunoglobulins, alpha-1 antitrypsin, CMV antibody, HBV antibody. - If he is not immune to hepatitis A or B, then we will offer vaccines. - If LFTs are elevated, then stop Mounjaro.  Follow up based on lab results and GI symptoms.  [name], PA-C

## 2024-09-10 ENCOUNTER — Encounter: Payer: Self-pay | Admitting: Physician Assistant

## 2024-09-10 ENCOUNTER — Ambulatory Visit: Admitting: Physician Assistant

## 2024-09-10 ENCOUNTER — Other Ambulatory Visit (INDEPENDENT_AMBULATORY_CARE_PROVIDER_SITE_OTHER)

## 2024-09-10 VITALS — BP 138/82 | HR 93 | Ht 67.75 in | Wt 222.0 lb

## 2024-09-10 DIAGNOSIS — R7989 Other specified abnormal findings of blood chemistry: Secondary | ICD-10-CM | POA: Diagnosis not present

## 2024-09-10 LAB — HEPATIC FUNCTION PANEL
ALT: 107 U/L — ABNORMAL HIGH (ref 0–53)
AST: 52 U/L — ABNORMAL HIGH (ref 0–37)
Albumin: 4.5 g/dL (ref 3.5–5.2)
Alkaline Phosphatase: 67 U/L (ref 39–117)
Bilirubin, Direct: 0.1 mg/dL (ref 0.0–0.3)
Total Bilirubin: 0.5 mg/dL (ref 0.2–1.2)
Total Protein: 7.3 g/dL (ref 6.0–8.3)

## 2024-09-10 NOTE — Patient Instructions (Signed)
 Please go to the lab in the basement of our building to have lab work done as you leave today. Hit B for basement when you get on the elevator.  When the doors open the lab is on your left.  We will call you with the results. Thank you.  Follow up as needed.  Thank you for entrusting me with your care and for choosing [facility], [name], [place]   _______________________________________________________  If your blood pressure at your visit was 140/90 or greater, please contact your primary care physician to follow up on this.  _______________________________________________________  If you are age 60 or older, your body mass index should be between 23-30. Your Body mass index is 34 kg/m. If this is out of the aforementioned range listed, please consider follow up with your Primary Care Provider.  If you are age 35 or younger, your body mass index should be between 19-25. Your Body mass index is 34 kg/m. If this is out of the aformentioned range listed, please consider follow up with your Primary Care Provider.   ________________________________________________________  The [facility] providers would like to encourage you to use MYCHART to communicate with providers for non-urgent requests or questions.  Due to long hold times on the telephone, sending your provider a message by [facility] may be a faster and more efficient way to get a response.  Please allow 48 business hours for a response.  Please remember that this is for non-urgent requests.  _______________________________________________________  [facility] is using a team-based approach to care.  Your team is made up of your doctor and two to three APPS. Our APPS (Nurse Practitioners and Physician Assistants) work with your physician to ensure care continuity for you. They are fully qualified to address your health concerns and develop a treatment plan. They communicate directly with your gastroenterologist to  care for you. Seeing the Advanced Practice Practitioners on your physician's team can help you by facilitating care more promptly, often allowing for earlier appointments, access to diagnostic testing, procedures, and other specialty referrals.    SABRA

## 2024-09-13 ENCOUNTER — Ambulatory Visit: Payer: Self-pay | Admitting: Physician Assistant

## 2024-09-13 DIAGNOSIS — M791 Myalgia, unspecified site: Secondary | ICD-10-CM

## 2024-09-13 DIAGNOSIS — R7989 Other specified abnormal findings of blood chemistry: Secondary | ICD-10-CM

## 2024-09-13 DIAGNOSIS — M2550 Pain in unspecified joint: Secondary | ICD-10-CM

## 2024-09-13 DIAGNOSIS — R7689 Other specified abnormal immunological findings in serum: Secondary | ICD-10-CM

## 2024-09-15 LAB — IRON,TIBC AND FERRITIN PANEL
%SAT: 38 % (ref 20–48)
Ferritin: 166 ng/mL (ref 38–380)
Iron: 128 ug/dL (ref 50–180)
TIBC: 338 ug/dL (ref 250–425)

## 2024-09-15 LAB — ALPHA-1-ANTITRYPSIN: A-1 Antitrypsin, Ser: 137 mg/dL (ref 83–199)

## 2024-09-15 LAB — HEPATITIS A ANTIBODY, TOTAL: Hepatitis A AB,Total: NONREACTIVE

## 2024-09-15 LAB — HEPATITIS B SURFACE ANTIGEN: Hepatitis B Surface Ag: NONREACTIVE

## 2024-09-15 LAB — HEPATITIS A ANTIBODY, IGM: Hep A IgM: NONREACTIVE

## 2024-09-15 LAB — HEPATITIS B CORE ANTIBODY, TOTAL: Hep B Core Total Ab: NONREACTIVE

## 2024-09-15 LAB — EPSTEIN-BARR VIRUS VCA ANTIBODY PANEL
EBV NA IgG: >600 U/mL — ABNORMAL HIGH
EBV VCA IgG: 511 U/mL — ABNORMAL HIGH
EBV VCA IgM: <36 U/mL

## 2024-09-15 LAB — HEPATITIS B CORE ANTIBODY, IGM: Hep B C IgM: NONREACTIVE

## 2024-09-15 LAB — CMV ABS, IGG+IGM (CYTOMEGALOVIRUS)
CMV IgM: <30 [AU]/ml
Cytomegalovirus Ab-IgG: <0.6 U/mL

## 2024-09-15 LAB — ANA: Anti Nuclear Antibody (ANA): POSITIVE — AB

## 2024-09-15 LAB — ANTI-NUCLEAR AB-TITER (ANA TITER): ANA Titer 1: 1:40 {titer} — ABNORMAL HIGH

## 2024-09-15 LAB — HEPATITIS B SURFACE ANTIBODY,QUALITATIVE: Hep B S Ab: REACTIVE — AB

## 2024-09-15 LAB — HEPATITIS C ANTIBODY: Hepatitis C Ab: NONREACTIVE

## 2024-09-15 LAB — ANTI-SMOOTH MUSCLE ANTIBODY, IGG: Actin (Smooth Muscle) Antibody (IGG): <20 U (ref <20)

## 2024-09-15 LAB — MITOCHONDRIAL ANTIBODIES: Mitochondrial M2 Ab, IgG: <=20 U (ref <=20.0)

## 2024-09-15 LAB — CERULOPLASMIN: Ceruloplasmin: 24 mg/dL (ref 14–30)

## 2024-10-05 NOTE — Progress Notes (Signed)
 Addendum: Reviewed and agree with assessment and management plan. Would check CBC and INR at next lab eval.  I recommend we continue to follow LFTs and if not normalizing then consider liver bx Mild splenomegaly, followup platelet count Gordy HERO. Artavius Stearns, M.D.  10/05/2024

## 2024-10-07 ENCOUNTER — Other Ambulatory Visit: Payer: Self-pay

## 2024-10-07 DIAGNOSIS — R7989 Other specified abnormal findings of blood chemistry: Secondary | ICD-10-CM

## 2024-10-07 NOTE — Progress Notes (Signed)
 Pt notified via mychart, orders in for labs. Reminder placed in epic for pt to have labs and ov scheduled with tina garrett pa in Jan.

## 2024-10-13 NOTE — Progress Notes (Unsigned)
 Office Visit Note  Patient: Zachary Herman             Date of Birth: Jun 15, 1971           MRN: [id number]             PCP: Gerome Brunet, DO Referring: [place], PA-C Visit Date: 10/27/2024 Occupation: Data Unavailable  Subjective:  Positive ANA   History of Present Illness: Zachary Herman is a 53 y.o. male who presents today for a new patient consultation.  Patient reports that he has been referred by his gastroenterologist due to a low positive ANA.  Patient states that he has been under the care of of gastroenterology due to an elevation of liver enzymes.  Patient states that back in May he experienced a week of increased body aches, fatigue, and headaches.  Patient states that a week later he developed diffuse itching and a rash.  He had lab work obtained on 05/06/2024 which revealed a significant elevation of LFTs.  His LFTs were rechecked again on 05/14/2024 and remained elevated.  He was advised to discontinue Mounjaro at that time due to the concern that this may be an adverse effect.  He resumed Mounjaro in early July 2025.  He states that his liver enzymes have remained elevated but have improved.  He has been avoiding use of acetaminophen.  He denies any symptoms of Raynaud's phenomenon.  He denies any significant sicca symptoms.  He denies any oral or nasal ulcerations.  His energy level has been stable.  He denies any malar rash.  He denies any signs of alopecia.  He denies any known family rheumatologic history. Patient states for the last 10 to 15 years he has experienced intermittent discomfort in the right side of his lower back.  Patient states he was previously evaluated by chiropractor who obtained x-rays.  He has been told in the past he has an anterior pelvic tilt as well as some curvature of his lumbar spine.  Patient has gone to integrative therapies in the past and has noticed improvement with massage therapy and range of motion exercises.  Patient works from home and sits for  prolonged periods of time and often times will feel increased discomfort in the anterior aspect of the left hip and increased discomfort in the right side of her lower back.  He denies any numbness or tingling down his legs. He has occasional stiffness in his hands but denies any joint swelling.  He denies any other joint pain or joint swelling at this time.   Activities of Daily Living:  Patient reports morning stiffness for 1 hour.   Patient Reports nocturnal pain.  Difficulty dressing/grooming: Denies Difficulty climbing stairs: Denies Difficulty getting out of chair: Denies Difficulty using hands for taps, buttons, cutlery, and/or writing: Denies  Review of Systems  Constitutional:  Positive for fatigue.  HENT:  Negative for mouth sores.   Eyes:  Negative for dryness.  Respiratory:  Negative for shortness of breath.   Cardiovascular:  Positive for swelling in legs/feet. Negative for chest pain and palpitations.  Gastrointestinal:  Positive for constipation. Negative for blood in stool and diarrhea.  Endocrine: Positive for increased urination.  Genitourinary:  Negative for involuntary urination.  Musculoskeletal:  Positive for joint pain, joint pain, myalgias, muscle weakness, morning stiffness, muscle tenderness and myalgias. Negative for gait problem and joint swelling.  Skin:  Negative for color change, rash, hair loss and sensitivity to sunlight.  Allergic/Immunologic: Negative for susceptible to infections.  Neurological:  Negative for dizziness and headaches.  Hematological:  Negative for swollen glands.  Psychiatric/Behavioral:  Negative for depressed mood and sleep disturbance. The patient is not nervous/anxious.     PMFS History:  Patient Active Problem List   Diagnosis Date Noted   Impacted cerumen of left ear 06/23/2024   Left-sided tinnitus 06/23/2024   Sensorineural hearing loss, unilateral, left ear, with unrestricted hearing on the contralateral side 06/23/2024    Palpitations 11/15/2019    Past Medical History:  Diagnosis Date   Adult ADHD    Allergic rhinitis    Esophageal reflux    Gallstones 05/2024   MRCP   High triglycerides    Hyperlipidemia, mild    On fish oil.   Hypertension    IBS (irritable bowel syndrome)    OSA on CPAP    Overweight    Sensorineural hearing loss, unilateral, left ear, with unrestricted hearing on the contralateral side    Sleep apnea    APAP   Type 2 diabetes mellitus without complications (HCC)     Family History  Problem Relation Age of Onset   Healthy Mother        Living, 60   Lymphoma Father    Diabetes Father    Hypertension Father    Stroke Father    Heart disease Father    Heart attack Father    Healthy Brother    Breast cancer Maternal Aunt    Breast cancer Paternal Aunt    Diabetes Paternal Grandfather    Healthy Son    Colon cancer Neg Hx    Esophageal cancer Neg Hx    Past Surgical History:  Procedure Laterality Date   COLONOSCOPY     TYMPANOSTOMY TUBE PLACEMENT     Social History   Tobacco Use   Smoking status: Former    Current packs/day: 0.00    Types: Cigarettes    Quit date: 01/23/1998    Years since quitting: 26.7    Passive exposure: Past   Smokeless tobacco: Never   Tobacco comments:    Quit 18+ years  Vaping Use   Vaping status: Never Used  Substance Use Topics   Alcohol use: Yes    Alcohol/week: 3.0 standard drinks of alcohol    Types: 3 Standard drinks or equivalent per week    Comment: social wine,   Drug use: No    Comment: Previously meth, LSD, marijuana    Social History   Social History Narrative   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   He lives with wife and son at home.      He walks 2-3 times a day 20 to 30 minutes.  5 to 7 days a week     Immunization History  Administered Date(s) Administered   Influenza-Unspecified 09/09/2018      Objective: Vital Signs: BP 131/77 (BP Location: Right Arm, Patient Position: Sitting, Cuff Size: Large)   Pulse 83   Temp 98.3 F (36.8 C)   Resp 16   Ht 5' 7.5 (1.715 m)   Wt 220 lb 9.6 oz (100.1 kg)   BMI 34.04 kg/m    Physical Exam Vitals and nursing note reviewed.  Constitutional:      Appearance: He is well-developed.  HENT:     Head: Normocephalic and atraumatic.  Eyes:     Conjunctiva/sclera: Conjunctivae normal.  Pupils: Pupils are equal, round, and reactive to light.  Cardiovascular:     Rate and Rhythm: Normal rate and regular rhythm.     Heart sounds: Normal heart sounds.  Pulmonary:     Effort: Pulmonary effort is normal.     Breath sounds: Normal breath sounds.  Abdominal:     General: Bowel sounds are normal.     Palpations: Abdomen is soft.  Musculoskeletal:     Cervical back: Normal range of motion and neck supple.  Skin:    General: Skin is warm and dry.     Capillary Refill: Capillary refill takes less than 2 seconds.  Neurological:     Mental Status: He is alert and oriented to person, place, and time.  Psychiatric:        Behavior: Behavior normal.      Musculoskeletal Exam: C-spine, thoracic spine, lumbar spine have good range of motion.  Mild discomfort with extension of the lumbar spine.  Good forward flexion of the lumbar spine.  No SI joint tenderness.  Shoulder joints, elbow joints, wrist joints, MCPs, PIPs, DIPs have good range of motion with no synovitis.  Complete fist formation bilaterally.  Hip joints have good range of motion with no groin pain. No tenderness over the trochanteric bursa at this time.  Knee joints have good range of motion no warmth or effusion.  Ankle joints have good range of motion no tenderness or joint swelling.    CDAI Exam: CDAI Score: -- Patient Global: --; Provider Global: -- Swollen: --; Tender: -- Joint Exam 10/27/2024   No joint exam has been documented for this visit   There is currently no  information documented on the homunculus. Go to the Rheumatology activity and complete the homunculus joint exam.  Investigation: No additional findings.  Imaging: No results found.  Recent Labs: Lab Results  Component Value Date   WBC 9.1 03/06/2017   HGB 14.9 03/06/2017   PLT 302.0 03/06/2017   NA 138 03/06/2017   K 4.5 03/06/2017   CL 101 03/06/2017   CO2 31 03/06/2017   GLUCOSE 101 (H) 03/06/2017   BUN 14 03/06/2017   CREATININE 0.94 03/06/2017   BILITOT 0.5 09/10/2024   ALKPHOS 67 09/10/2024   AST 52 (H) 09/10/2024   ALT 107 (H) 09/10/2024   PROT 7.3 09/10/2024   ALBUMIN 4.5 09/10/2024   CALCIUM 9.6 03/06/2017    Speciality Comments: No specialty comments available.  Procedures:  No procedures performed Allergies: Tetracyclines & related   Assessment / Plan:     Visit Diagnoses: Positive ANA (antinuclear antibody) - 09/10/24: ANA 1:40NS, elevated LFTs-AMA and ASMA negative: No clinical features of systemic lupus at this time.  No evidence of primary biliary cholangitis or autoimmune hepatitis per GI.  No known family history of any rheumatologic conditions.   No symptoms of Raynaud's phenomenon, sicca symptoms, oral or nasal ulcerations, signs of alopecia, or Malar rash.  No synovitis noted on examination today.  No pleuritic chest pain.  Lungs were clear to auscultation. In May he experienced a week of increased fatigue, headaches, and bodyaches.  About a week after he had diffuse itching.  According to the patient his son experience similar symptoms concerning for some sort of infection.  Patient had lab work obtained on 05/06/2024 which revealed a significant elevation of LFTs prompting further evaluation.  He has been evaluated by gastroenterology and was found to have a low positive ANA. Plan to obtain the following lab work today for  further evaluation.  Plan to discuss lab work at [facility].   - Plan: ANA, Anti-scleroderma antibody, RNP Antibody, Anti-Smith antibody,  Sjogrens syndrome-A extractable nuclear antibody, Sjogrens syndrome-B extractable nuclear antibody, Anti-DNA antibody, double-stranded, C3 and C4, Sedimentation rate, C-reactive protein, Rheumatoid factor, Cyclic citrul peptide antibody, IgG, Hepatic function panel, CK, Aldolase  Muscle pain - No muscular weakness noted.  Plan to check CK and aldolase today.  Plan: CK, Aldolase  Polyarthralgia -No synovitis noted on examination. Intermittent stiffness in both hands-not consistent.  Declined x-rays of both hands at this time. Plan to obtain the following lab work today.  He has been experiencing intermittent discomfort in his lower back off-and-on for the past 10 to 15 years.  He was previous evaluated by chiropractor followed by integrative therapies.  He has not been to integrative therapies in about 2 years. X-rays of the lumbar spine and pelvis obtained today.     Plan: ANA, Anti-scleroderma antibody, RNP Antibody, Anti-Smith antibody, Sjogrens syndrome-A extractable nuclear antibody, Sjogrens syndrome-B extractable nuclear antibody, Anti-DNA antibody, double-stranded, C3 and C4, Sedimentation rate, C-reactive protein, Rheumatoid factor, Cyclic citrul peptide antibody, IgG, Hepatic function panel  Elevated liver enzymes -05/06/24:ALT 1527, AST 779, alk phos 223, direct bilirubin 0.89, total bilirubin 1.4.  MRI of the abdomen MRCP on 05/10/2024: Hepatomegaly.  No focal liver lesion.  Sludge and tiny gallstones noted.  Mild splenomegaly noted Underwent workup by gastroenterology: Additional labs obtained on 09/10/24-previous EBV infection.  No current infection. Immune to hepatitis B.  No current hepatitis A, B, or C infection.  Alpha-1 antitrypsin deficiency and Wilson's disease negative.  AMA and ASMA negative-no evidence of primary biliary cirrhosis or autoimmune hepatitis.  CMV infection negative.  Normal iron levels--no evidence of iron overload/hemochromatosis. ANA was low positive 1: 40 NS--plan to  obtain the following lab work today.  Low suspicion for underlying rheumatologic condition at this time. There is been concern that the liver enzyme elevation may be due to the use of Mounjaro.  He discontinued Mounjaro for a couple of months but his liver enzymes did not improve so he has since resumed in early January 2025. There has been discussion of a possible liver biopsy.  Plan to obtain the following lab work today. plan: Hepatic function panel  Pelvic pain - 10-15 years-intermittent. Previously evaluated by a chiropractor.  Previously under the care of integrative therapies.  Discomfort with extension of the lumbar spine.  Good forward flexion.  No midline spinal tenderness.  No SI joint tenderness.  No symptoms of radiculopathy.  Plan to update the following x-rays today.  Plan: XR Pelvis 1-2 Views, XR Lumbar Spine 2-3 Views  Chronic right-sided low back pain without sciatica -He has been experiencing intermittent right sided low back pain x10-15 years. Evaluated by chiropractor and integrative therapies in the past.  Plan to update x-rays of the lumbar spine today.  Plan: XR Lumbar Spine 2-3 Views  Other medical conditions are listed as follows:   Sensorineural hearing loss, unilateral, left ear, with unrestricted hearing on the contralateral side - with tinnitus  Impacted cerumen of left ear  History of type 2 diabetes mellitus: Currently on Mounjaro 7.5 mg weekly.   Essential hypertension: Blood pressure was 131/77 today in the office.    High cholesterol   Orders: Orders Placed This Encounter  Procedures   XR Pelvis 1-2 Views   XR Lumbar Spine 2-3 Views   ANA   Anti-scleroderma antibody   RNP Antibody   Anti-Smith antibody  Sjogrens syndrome-A extractable nuclear antibody   Sjogrens syndrome-B extractable nuclear antibody   Anti-DNA antibody, double-stranded   C3 and C4   Sedimentation rate   C-reactive protein   Rheumatoid factor   Cyclic citrul peptide antibody,  IgG   Hepatic function panel   CK   Aldolase   No orders of the defined types were placed in this encounter.    Follow-Up Instructions: Return for NPFU.   Waddell CHRISTELLA Craze, PA-C  Note - This record has been created using Dragon software.  Chart creation errors have been sought, but may not always  have been located. Such creation errors do not reflect on  the standard of medical care.

## 2024-10-27 ENCOUNTER — Ambulatory Visit

## 2024-10-27 ENCOUNTER — Ambulatory Visit: Attending: Physician Assistant | Admitting: Physician Assistant

## 2024-10-27 ENCOUNTER — Encounter: Payer: Self-pay | Admitting: Physician Assistant

## 2024-10-27 VITALS — BP 131/77 | HR 83 | Temp 98.3°F | Resp 16 | Ht 67.5 in | Wt 220.6 lb

## 2024-10-27 DIAGNOSIS — R102 Pelvic and perineal pain unspecified side: Secondary | ICD-10-CM

## 2024-10-27 DIAGNOSIS — G8929 Other chronic pain: Secondary | ICD-10-CM | POA: Diagnosis not present

## 2024-10-27 DIAGNOSIS — M2550 Pain in unspecified joint: Secondary | ICD-10-CM

## 2024-10-27 DIAGNOSIS — H6122 Impacted cerumen, left ear: Secondary | ICD-10-CM

## 2024-10-27 DIAGNOSIS — M545 Low back pain, unspecified: Secondary | ICD-10-CM | POA: Diagnosis not present

## 2024-10-27 DIAGNOSIS — R7689 Other specified abnormal immunological findings in serum: Secondary | ICD-10-CM | POA: Diagnosis not present

## 2024-10-27 DIAGNOSIS — R002 Palpitations: Secondary | ICD-10-CM

## 2024-10-27 DIAGNOSIS — R748 Abnormal levels of other serum enzymes: Secondary | ICD-10-CM

## 2024-10-27 DIAGNOSIS — I10 Essential (primary) hypertension: Secondary | ICD-10-CM

## 2024-10-27 DIAGNOSIS — H9042 Sensorineural hearing loss, unilateral, left ear, with unrestricted hearing on the contralateral side: Secondary | ICD-10-CM

## 2024-10-27 DIAGNOSIS — M791 Myalgia, unspecified site: Secondary | ICD-10-CM

## 2024-10-27 DIAGNOSIS — E7800 Pure hypercholesterolemia, unspecified: Secondary | ICD-10-CM

## 2024-10-27 DIAGNOSIS — Z8639 Personal history of other endocrine, nutritional and metabolic disease: Secondary | ICD-10-CM

## 2024-10-29 LAB — HEPATIC FUNCTION PANEL
AG Ratio: 1.8 (calc) (ref 1.0–2.5)
ALT: 88 U/L — ABNORMAL HIGH (ref 9–46)
AST: 46 U/L — ABNORMAL HIGH (ref 10–35)
Albumin: 4.8 g/dL (ref 3.6–5.1)
Alkaline phosphatase (APISO): 86 U/L (ref 35–144)
Bilirubin, Direct: 0.1 mg/dL (ref 0.0–0.2)
Globulin: 2.7 g/dL (ref 1.9–3.7)
Indirect Bilirubin: 0.3 mg/dL (ref 0.2–1.2)
Total Bilirubin: 0.4 mg/dL (ref 0.2–1.2)
Total Protein: 7.5 g/dL (ref 6.1–8.1)

## 2024-10-29 LAB — C3 AND C4
C3 Complement: 146 mg/dL (ref 82–185)
C4 Complement: 38 mg/dL (ref 15–53)

## 2024-10-29 LAB — RNP ANTIBODY: Ribonucleic Protein(ENA) Antibody, IgG: <1 AI

## 2024-10-29 LAB — ANTI-DNA ANTIBODY, DOUBLE-STRANDED: ds DNA Ab: 5 [IU]/mL — ABNORMAL HIGH

## 2024-10-29 LAB — SEDIMENTATION RATE: Sed Rate: 6 mm/h (ref 0–20)

## 2024-10-29 LAB — ANA: Anti Nuclear Antibody (ANA): POSITIVE — AB

## 2024-10-29 LAB — ALDOLASE: Aldolase: 5.7 U/L (ref <=8.1)

## 2024-10-29 LAB — SJOGRENS SYNDROME-B EXTRACTABLE NUCLEAR ANTIBODY: SSB (La) (ENA) Antibody, IgG: <1 AI

## 2024-10-29 LAB — RHEUMATOID FACTOR: Rheumatoid fact SerPl-aCnc: <10 [IU]/mL (ref <14)

## 2024-10-29 LAB — SJOGRENS SYNDROME-A EXTRACTABLE NUCLEAR ANTIBODY: SSA (Ro) (ENA) Antibody, IgG: <1 AI

## 2024-10-29 LAB — ANTI-SMITH ANTIBODY: ENA SM Ab Ser-aCnc: <1 AI

## 2024-10-29 LAB — CK: Total CK: 37 U/L (ref 23–325)

## 2024-10-29 LAB — ANTI-SCLERODERMA ANTIBODY: Scleroderma (Scl-70) (ENA) Antibody, IgG: <1 AI

## 2024-10-29 LAB — C-REACTIVE PROTEIN: CRP: <3 mg/L (ref <8.0)

## 2024-10-29 LAB — CYCLIC CITRUL PEPTIDE ANTIBODY, IGG: Cyclic Citrullin Peptide Ab: 26 U — ABNORMAL HIGH

## 2024-10-29 LAB — ANTI-NUCLEAR AB-TITER (ANA TITER): ANA Titer 1: 1:40 {titer} — ABNORMAL HIGH

## 2024-10-30 ENCOUNTER — Ambulatory Visit: Payer: Self-pay | Admitting: Physician Assistant

## 2024-10-30 DIAGNOSIS — G8929 Other chronic pain: Secondary | ICD-10-CM

## 2024-10-30 DIAGNOSIS — R102 Pelvic and perineal pain unspecified side: Secondary | ICD-10-CM

## 2024-10-30 DIAGNOSIS — M2550 Pain in unspecified joint: Secondary | ICD-10-CM

## 2024-10-30 DIAGNOSIS — R7689 Other specified abnormal immunological findings in serum: Secondary | ICD-10-CM

## 2024-10-30 NOTE — Progress Notes (Signed)
 Attempted to call patient to discuss results.

## 2024-11-02 NOTE — Progress Notes (Signed)
 I called the patient to discuss lab results today in detail. Discussed that ANA remains borderline positive.  Double-stranded DNA is within the indeterminate range.  Complements within normal limits which was reassuring. Anti-CCP is a weak positive but rheumatoid factor is negative.  ESR and CRP are within normal limits.  CK and aldolase were within normal limits.  AST and ALT remain elevated but have improved. SCL 70 negative, RNP negative, Ro antibody negative, and La antibody negative. Discussed that he does not meet criteria for rheumatoid arthritis or systemic lupus at this time.  Recommend repeating CBC, CMP, ANA, ESR, CRP, anti-CCP, complements, double-stranded DNA, and urine protein creatinine ratio at the follow-up visit scheduled on 12/22/2024. He has no clinical features of systemic lupus at this time. Discussed signs and symptoms to monitor for closely.  Discussed the option of scheduling an ultrasound in the future if he develops increased joint pain or joint swelling especially in the hands or feet.  Discussed x-ray results today in detail.  Plan to place referral to integrative therapies as discussed. All questions were addressed.  He was advised to notify us  if he develops any new or worsening symptoms.  Please forward results to PCP as requested.

## 2024-11-03 NOTE — Telephone Encounter (Signed)
 Will place future lab orders. Will place referral to integrative therapies. Will forward results to PCP.

## 2024-12-08 NOTE — Progress Notes (Signed)
 "  Office Visit Note  Patient: Zachary Herman             Date of Birth: 01-15-1971           MRN: [id number]             PCP: Gerome Brunet, DO Referring: Gerome Brunet, DO Visit Date: 12/22/2024 Occupation: Data Unavailable  Subjective:  Discuss results   History of Present Illness: Zachary Herman is a 53 y.o. male who presents today to discuss results from the initial office visit.  Patient denies any new concerns since the last office visit.  Patient states that he has established care with integrative therapies but has not yet noticed any benefit.  Patient states that he is currently recovering from a viral upper respiratory tract infection and is likely going to cancel his appointment at integrative therapies tomorrow.  Patient states that he was given home exercises but has not yet initiated these.  He continues to have occasional stiffness and aching in the lower back and both hips.  He denies any other joint pain or joint swelling currently.  He denies any other features of autoimmune disease.  Patient states that he is following up with gastroenterology on 01/07/2025 at which time he plans on having his LFTs rechecked.  Patient states that he did have to take an over-the-counter cold and flu medication last night which had acetaminophen but he has otherwise been avoiding acetaminophen and alcohol use.     Component     Latest Ref Rng 10/27/2024  Total Protein     6.1 - 8.1 g/dL 7.5   Albumin MSPROF     3.6 - 5.1 g/dL 4.8   Globulin     1.9 - 3.7 g/dL (calc) 2.7   AG Ratio     1.0 - 2.5 (calc) 1.8   Total Bilirubin     0.2 - 1.2 mg/dL 0.4   Bilirubin, Direct     0.0 - 0.2 mg/dL 0.1   Indirect Bilirubin     0.2 - 1.2 mg/dL (calc) 0.3   Alkaline phosphatase (APISO)     35 - 144 U/L 86   AST     10 - 35 U/L 46 (H)   ALT     9 - 46 U/L 88 (H)   C3 Complement     82 - 185 mg/dL 853   C4 Complement     15 - 53 mg/dL 38   ANA Titer 1     titer 1:40 (H)   ANA Pattern 1 Nuclear,  Speckled !   Anti Nuclear Antibody (ANA)     NEGATIVE  POSITIVE !   Scleroderma (Scl-70) (ENA) Antibody, IgG     <1.0 NEG AI <1.0 NEG   Ribonucleic Protein(ENA) Antibody, IgG     <1.0 NEG AI <1.0 NEG   ENA SM Ab Ser-aCnc     <1.0 NEG AI <1.0 NEG   SSA (Ro) (ENA) Antibody, IgG     <1.0 NEG AI <1.0 NEG   SSB (La) (ENA) Antibody, IgG     <1.0 NEG AI <1.0 NEG   ds DNA Ab     IU/mL 5 (H)   Sed Rate     0 - 20 mm/h 6   CRP     <8.0 mg/L <3.0   RA Latex Turbid.     <14 IU/mL <10   Cyclic Citrullin Peptide Ab     UNITS 26 (H)   CK Total  23 - 325 U/L 37   Aldolase     < OR = 8.1 U/L 5.7     Legend: (H) High ! Abnormal  Activities of Daily Living:  Patient reports morning stiffness for 10 minutes.   Patient Denies nocturnal pain.  Difficulty dressing/grooming: Denies Difficulty climbing stairs: Denies Difficulty getting out of chair: Denies Difficulty using hands for taps, buttons, cutlery, and/or writing: Denies  Review of Systems  Constitutional:  Negative for fatigue.  HENT:  Negative for mouth sores and mouth dryness.   Eyes:  Negative for dryness.  Respiratory:  Negative for shortness of breath.   Cardiovascular:  Negative for chest pain and palpitations.  Gastrointestinal:  Negative for blood in stool, constipation and diarrhea.  Endocrine: Negative for increased urination.  Genitourinary:  Negative for involuntary urination.  Musculoskeletal:  Positive for joint pain, joint pain, myalgias, morning stiffness and myalgias. Negative for gait problem, joint swelling, muscle weakness and muscle tenderness.  Skin:  Negative for color change, rash, hair loss and sensitivity to sunlight.  Allergic/Immunologic: Negative for susceptible to infections.  Neurological:  Negative for dizziness and headaches.  Hematological:  Negative for swollen glands.  Psychiatric/Behavioral:  Negative for depressed mood and sleep disturbance. The patient is not nervous/anxious.     PMFS  History:  Patient Active Problem List   Diagnosis Date Noted   Impacted cerumen of left ear 06/23/2024   Left-sided tinnitus 06/23/2024   Sensorineural hearing loss, unilateral, left ear, with unrestricted hearing on the contralateral side 06/23/2024   Palpitations 11/15/2019    Past Medical History:  Diagnosis Date   Adult ADHD    Allergic rhinitis    Esophageal reflux    Gallstones 05/2024   MRCP   High triglycerides    Hyperlipidemia, mild    On fish oil.   Hypertension    IBS (irritable bowel syndrome)    OSA on CPAP    Overweight    Sensorineural hearing loss, unilateral, left ear, with unrestricted hearing on the contralateral side    Sleep apnea    APAP   Type 2 diabetes mellitus without complications (HCC)     Family History  Problem Relation Age of Onset   Healthy Mother        Living, 45   Lymphoma Father    Diabetes Father    Hypertension Father    Stroke Father    Heart disease Father    Heart attack Father    Healthy Brother    Breast cancer Maternal Aunt    Breast cancer Paternal Aunt    Diabetes Paternal Grandfather    Healthy Son    Colon cancer Neg Hx    Esophageal cancer Neg Hx    Past Surgical History:  Procedure Laterality Date   COLONOSCOPY     TYMPANOSTOMY TUBE PLACEMENT     Social History[1] Social History   Social History Narrative   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   He lives with wife and son at home.      He walks 2-3 times a day 20 to 30 minutes.  5 to 7 days a week     Immunization History  Administered Date(s) Administered   Influenza-Unspecified 09/09/2018     Objective: Vital Signs: BP (!) 163/83   Pulse 89   Temp 98.5 F (36.9 C)   Resp 14   Ht  5' 8 (1.727 m)   Wt 223 lb 12.8 oz (101.5 kg)   BMI 34.03 kg/m    Physical Exam Vitals and nursing note reviewed.  Constitutional:      Appearance: He is  well-developed.  HENT:     Head: Normocephalic and atraumatic.  Eyes:     Conjunctiva/sclera: Conjunctivae normal.     Pupils: Pupils are equal, round, and reactive to light.  Cardiovascular:     Rate and Rhythm: Normal rate and regular rhythm.     Heart sounds: Normal heart sounds.  Pulmonary:     Effort: Pulmonary effort is normal.     Breath sounds: Normal breath sounds.  Abdominal:     General: Bowel sounds are normal.     Palpations: Abdomen is soft.  Musculoskeletal:     Cervical back: Normal range of motion and neck supple.  Skin:    General: Skin is warm and dry.     Capillary Refill: Capillary refill takes less than 2 seconds.  Neurological:     Mental Status: He is alert and oriented to person, place, and time.  Psychiatric:        Behavior: Behavior normal.      Musculoskeletal Exam: C-spine has good range of motion.  Discomfort extension of the lumbar spine.  Levoscoliosis.  Shoulder joints, elbow joints, wrist joints, MCPs, PIPs, DIPs have good range of motion with no synovitis.  Complete fist formation bilaterally.  Hip joints have good range of motion with no groin pain.  Knee joints have good range of motion no warmth or effusion.  Ankle joints have good range of motion no tenderness or joint swelling.  No evidence of Achilles tendinitis or plantar fasciitis.   CDAI Exam: CDAI Score: -- Patient Global: --; Provider Global: -- Swollen: --; Tender: -- Joint Exam 12/22/2024   No joint exam has been documented for this visit   There is currently no information documented on the homunculus. Go to the Rheumatology activity and complete the homunculus joint exam.  Investigation: No additional findings.  Imaging: No results found.  Recent Labs: Lab Results  Component Value Date   WBC 9.1 03/06/2017   HGB 14.9 03/06/2017   PLT 302.0 03/06/2017   NA 138 03/06/2017   K 4.5 03/06/2017   CL 101 03/06/2017   CO2 31 03/06/2017   GLUCOSE 101 (H) 03/06/2017    BUN 14 03/06/2017   CREATININE 0.94 03/06/2017   BILITOT 0.4 10/27/2024   ALKPHOS 67 09/10/2024   AST 46 (H) 10/27/2024   ALT 88 (H) 10/27/2024   PROT 7.5 10/27/2024   ALBUMIN 4.5 09/10/2024   CALCIUM 9.6 03/06/2017    Speciality Comments: No specialty comments available.  Procedures:  No procedures performed Allergies: Tetracyclines & related   Assessment / Plan:     Visit Diagnoses: Positive ANA (antinuclear antibody) -  09/10/24: ANA 1:40NS, elevated LFTs-AMA and ASMA negative:  Lab work from 10/27/2024 was reviewed today in the office: AST 46, ALT 88, complements within normal limits, ANA 1: 40 NS, SCL 70 negative, RNP negative, Smith negative, Ro negative, La antibody negative, double-stranded DNA 5, ESR 6, CRP within normal limits, RF negative, anti-CCP 26, CK 37, aldolase 5.7. Discussed all results in detail and all questions were addressed.  He does not currently meet criteria for systemic lupus and does not have any clinical features of systemic lupus at this time.  Double-stranded DNA is within the indeterminate range and ANA is a low titer positive. Plan  to recheck the following lab work today.  Do not plan to initiate immunosuppressive therapy at this time.  Discussed signs and symptoms to monitor for closely.  Plan to follow-up in the office in 6 months or sooner if needed. Plan: ANA, Anti-DNA antibody, double-stranded, C3 and C4, Sedimentation rate, CBC with Differential/Platelet, Basic metabolic panel with GFR, Hepatic function panel, Protein / creatinine ratio, urine, C-reactive protein, HLA-B27 antigen  Positive anti-CCP test -anti-CCP was borderline elevated at 26 on 10/27/2024.  Rheumatoid factor negative.  He has no synovitis on examination today.  He has occasional joint stiffness but no active inflammation.  Discussed that if he develops any increased joint pain or joint swelling he should notify us  at which time we can proceed with further imaging.  Plan: Sedimentation  rate, C-reactive protein, Mutated Citrullinated Vimentin (MCV) Antibody  Elevated LFTs -Under the care of gastroenterology.  05/06/24:ALT 1527, AST 779, alk phos 223, direct bilirubin 0.89, total bilirubin 1.4.  MRI of the abdomen MRCP on 05/10/2024: Hepatomegaly.  No focal liver lesion.  Sludge and tiny gallstones noted.  Mild splenomegaly noted Underwent workup by gastroenterology: Additional labs obtained on 09/10/24-previous EBV infection.  No current infection. Immune to hepatitis B.  No current hepatitis A, B, or C infection.  Alpha-1 antitrypsin deficiency and Wilson's disease negative.  AMA and ASMA negative-no evidence of primary biliary cirrhosis or autoimmune hepatitis.  CMV infection negative.  Normal iron levels--no evidence of iron overload/hemochromatosis. ANA was low positive 1: 40 NS.  ANA was rechecked on 10/27/2024 at which time ANA was 1: 40 NS, AST was 46, and ALT was 88. Patient has been avoiding use of alcohol.  He currently is recovering from an upper respiratory tract infection and took a cold and flu over-the-counter medication with acetaminophen last night.  He has upcoming appointment scheduled with GI on 01/07/2025 so I recommended having an updated hepatic function panel at that time but he would also like to have it checked today.  Order was released--discussed that the LFTs may be more elevated due to acetaminophen use and the current infection. plan: Hepatic function panel  Pelvic pain -X-rays of the pelvis were unremarkable on 10/27/2024.  Patient has been referred to integrative therapies but has not yet noticed clinical benefit.  Encouraged patient to start home exercises.  Plan: HLA-B27 antigen  Chronic right-sided low back pain without sciatica - X-rays of the lumbar spine obtained on 10/27/2024 were consistent with lumbar spondylosis and facet joint arthropathy.  Levoscoliosis was noted.  Multiple spondylosis with significant narrowing between T10 and L5.  Large  osteophyte was noted between L2 and L3.  Patient continues to experience intermittent discomfort and stiffness affecting the lower back.  He has been referred to integrative therapies and has had 3-4 sessions but has not yet noticed any clinical benefit.  He has not been performing home exercises yet.  Plan to check HLA-B27.  Plan: HLA-B27 antigen  Osteophyte of vertebrae -plan to check HLA-B27.  Plan: HLA-B27 antigen  Muscle pain: CK and aldolase within normal limits on 10/27/2024.  No muscle weakness noted.  Other medical conditions are listed as follows:  Sensorineural hearing loss, unilateral, left ear, with unrestricted hearing on the contralateral side  Impacted cerumen of left ear  History of type 2 diabetes mellitus  Essential hypertension: Blood pressure was elevated today in the office was rechecked prior to leaving.  Patient was advised to monitor blood pressure closely and reach out to PCP if blood pressure remains elevated.  High cholesterol   Orders: Orders Placed This Encounter  Procedures   ANA   Anti-DNA antibody, double-stranded   C3 and C4   Sedimentation rate   CBC with Differential/Platelet   Basic metabolic panel with GFR   Hepatic function panel   Protein / creatinine ratio, urine   C-reactive protein   Mutated Citrullinated Vimentin (MCV) Antibody   HLA-B27 antigen   No orders of the defined types were placed in this encounter.     Follow-Up Instructions: No follow-ups on file.   Waddell CHRISTELLA Craze, PA-C  Note - This record has been created using Dragon software.  Chart creation errors have been sought, but may not always  have been located. Such creation errors do not reflect on  the standard of medical care.     [1]  Social History Tobacco Use   Smoking status: Former    Current packs/day: 0.00    Types: Cigarettes    Quit date: 01/23/1998    Years since quitting: 26.9    Passive exposure: Past   Smokeless tobacco: Never   Tobacco  comments:    Quit 18+ years  Vaping Use   Vaping status: Never Used  Substance Use Topics   Alcohol use: Yes    Alcohol/week: 3.0 standard drinks of alcohol    Types: 3 Standard drinks or equivalent per week    Comment: social wine,   Drug use: No    Comment: Previously meth, LSD, marijuana    "

## 2024-12-09 NOTE — Telephone Encounter (Signed)
-----   Message from Nurse Rock DEL, RN sent at 10/07/2024  8:46 AM EDT ----- Regarding: Labs and OV with Zachary Herman Pt needs labs and ov with [name] PA in Jan, orders in for labs.

## 2024-12-09 NOTE — Telephone Encounter (Signed)
 Pt scheduled for 01/07/25 at 9:40am. Labs to be drawn at time of OV and orders in epic.

## 2024-12-22 ENCOUNTER — Ambulatory Visit: Attending: Physician Assistant | Admitting: Physician Assistant

## 2024-12-22 ENCOUNTER — Encounter: Payer: Self-pay | Admitting: Physician Assistant

## 2024-12-22 VITALS — BP 163/83 | HR 89 | Temp 98.5°F | Resp 14 | Ht 68.0 in | Wt 223.8 lb

## 2024-12-22 DIAGNOSIS — H9042 Sensorineural hearing loss, unilateral, left ear, with unrestricted hearing on the contralateral side: Secondary | ICD-10-CM | POA: Diagnosis not present

## 2024-12-22 DIAGNOSIS — R7989 Other specified abnormal findings of blood chemistry: Secondary | ICD-10-CM | POA: Diagnosis not present

## 2024-12-22 DIAGNOSIS — R7681 Abnormal rheumatoid factor and anti-citrullinated protein antibody without rheumatoid arthritis: Secondary | ICD-10-CM

## 2024-12-22 DIAGNOSIS — R102 Pelvic and perineal pain unspecified side: Secondary | ICD-10-CM

## 2024-12-22 DIAGNOSIS — M2578 Osteophyte, vertebrae: Secondary | ICD-10-CM

## 2024-12-22 DIAGNOSIS — M791 Myalgia, unspecified site: Secondary | ICD-10-CM | POA: Diagnosis not present

## 2024-12-22 DIAGNOSIS — I10 Essential (primary) hypertension: Secondary | ICD-10-CM | POA: Diagnosis not present

## 2024-12-22 DIAGNOSIS — H6122 Impacted cerumen, left ear: Secondary | ICD-10-CM | POA: Diagnosis not present

## 2024-12-22 DIAGNOSIS — E7800 Pure hypercholesterolemia, unspecified: Secondary | ICD-10-CM | POA: Diagnosis not present

## 2024-12-22 DIAGNOSIS — R748 Abnormal levels of other serum enzymes: Secondary | ICD-10-CM

## 2024-12-22 DIAGNOSIS — Z8639 Personal history of other endocrine, nutritional and metabolic disease: Secondary | ICD-10-CM

## 2024-12-22 DIAGNOSIS — R7689 Other specified abnormal immunological findings in serum: Secondary | ICD-10-CM | POA: Diagnosis not present

## 2024-12-22 DIAGNOSIS — M545 Low back pain, unspecified: Secondary | ICD-10-CM | POA: Diagnosis not present

## 2024-12-22 DIAGNOSIS — G8929 Other chronic pain: Secondary | ICD-10-CM

## 2024-12-23 ENCOUNTER — Ambulatory Visit: Payer: Self-pay | Admitting: Physician Assistant

## 2024-12-23 NOTE — Progress Notes (Signed)
 WBC count is elevated-likely related to current URI.  RBC count is elevated.   CRP is elevated-may be related to infection.  Glucose is 118, rest of BMP WNL ESR WNL Complements WNL Urine protein creatinine ratio WNL Hepatic function panel WNL--great news!!

## 2024-12-25 NOTE — Progress Notes (Signed)
"   ANA negative, double-stranded DNA indeterminate, complements normal, sed rate normal, urine protein creatinine ratio normal, HLA-B27 negative, MCV pending.  Labs do not indicate lupus flare. "

## 2024-12-26 LAB — HEPATIC FUNCTION PANEL
AG Ratio: 1.7 (calc) (ref 1.0–2.5)
ALT: 38 U/L (ref 9–46)
AST: 25 U/L (ref 10–35)
Albumin: 4.4 g/dL (ref 3.6–5.1)
Alkaline phosphatase (APISO): 78 U/L (ref 35–144)
Bilirubin, Direct: 0.1 mg/dL (ref 0.0–0.2)
Globulin: 2.6 g/dL (ref 1.9–3.7)
Indirect Bilirubin: 0.3 mg/dL (ref 0.2–1.2)
Total Bilirubin: 0.4 mg/dL (ref 0.2–1.2)
Total Protein: 7 g/dL (ref 6.1–8.1)

## 2024-12-26 LAB — CBC WITH DIFFERENTIAL/PLATELET
Absolute Lymphocytes: 3025 {cells}/uL (ref 850–3900)
Absolute Monocytes: 992 {cells}/uL — ABNORMAL HIGH (ref 200–950)
Basophils Absolute: 85 {cells}/uL (ref 0–200)
Basophils Relative: 0.7 %
Eosinophils Absolute: 254 {cells}/uL (ref 15–500)
Eosinophils Relative: 2.1 %
HCT: 47.3 % (ref 39.4–51.1)
Hemoglobin: 15.7 g/dL (ref 13.2–17.1)
MCH: 26 pg — ABNORMAL LOW (ref 27.0–33.0)
MCHC: 33.2 g/dL (ref 31.6–35.4)
MCV: 78.4 fL — ABNORMAL LOW (ref 81.4–101.7)
MPV: 10 fL (ref 7.5–12.5)
Monocytes Relative: 8.2 %
Neutro Abs: 7744 {cells}/uL (ref 1500–7800)
Neutrophils Relative %: 64 %
Platelets: 335 Thousand/uL (ref 140–400)
RBC: 6.03 Million/uL — ABNORMAL HIGH (ref 4.20–5.80)
RDW: 12.7 % (ref 11.0–15.0)
Total Lymphocyte: 25 %
WBC: 12.1 Thousand/uL — ABNORMAL HIGH (ref 3.8–10.8)

## 2024-12-26 LAB — BASIC METABOLIC PANEL WITH GFR
BUN: 11 mg/dL (ref 7–25)
CO2: 32 mmol/L (ref 20–32)
Calcium: 9.8 mg/dL (ref 8.6–10.3)
Chloride: 100 mmol/L (ref 98–110)
Creat: 0.82 mg/dL (ref 0.70–1.30)
Glucose, Bld: 118 mg/dL — ABNORMAL HIGH (ref 65–99)
Potassium: 4.4 mmol/L (ref 3.5–5.3)
Sodium: 139 mmol/L (ref 135–146)
eGFR: 104 mL/min/1.73m2

## 2024-12-26 LAB — ANTI-DNA ANTIBODY, DOUBLE-STRANDED: ds DNA Ab: 5 [IU]/mL — ABNORMAL HIGH

## 2024-12-26 LAB — HLA-B27 ANTIGEN: HLA-B27 Antigen: NEGATIVE

## 2024-12-26 LAB — PROTEIN / CREATININE RATIO, URINE
Creatinine, Urine: 74 mg/dL (ref 20–320)
Protein/Creat Ratio: 95 mg/g{creat} (ref 25–148)
Protein/Creatinine Ratio: 0.095 mg/mg{creat} (ref 0.025–0.148)
Total Protein, Urine: 7 mg/dL (ref 5–25)

## 2024-12-26 LAB — C3 AND C4
C3 Complement: 126 mg/dL (ref 82–185)
C4 Complement: 35 mg/dL (ref 15–53)

## 2024-12-26 LAB — SEDIMENTATION RATE: Sed Rate: 2 mm/h (ref 0–20)

## 2024-12-26 LAB — C-REACTIVE PROTEIN: CRP: 14.8 mg/L — ABNORMAL HIGH

## 2024-12-26 LAB — ANA: Anti Nuclear Antibody (ANA): NEGATIVE

## 2024-12-26 LAB — MUTATED CITRULLINATED VIMENTIN (MCV) ANTIBODY: MUTATED CITRULLINATED VIMENTIN (MCV) AB: <20 U/mL

## 2024-12-27 NOTE — Progress Notes (Signed)
 MCV negative

## 2025-01-06 NOTE — Progress Notes (Unsigned)
 "     Zachary Console, PA-C [address] [place], [place]  [address] Phone: [phone]   Primary Care Physician: Gerome Brunet, DO  Primary Gastroenterologist:  Zachary Console, PA-C / Dr. Gordy Starch   Chief Complaint: Follow-up elevated LFTs       HPI:   Discussed the use of AI scribe software for clinical note transcription with the patient, who gave verbal consent to proceed.  Previous patient of Dr. Teressa.  Transferred care to Dr. Starch.  I last saw patient 09/2024 to evaluate elevated liver transaminases and alkaline phosphatase.  Please see that note for details.   He was sick with viral illness April 2025.  He was also on Mounjaro.  Labs May 2025 showed severely elevated LFTs.  Mounjaro was discontinued.  LFTs improved.   RUQ ultrasound and abdominal MRI/MRCP showed gallbladder sludge and tiny gallstones, however no evidence of choledocholithiasis. He has not had any significant RUQ pain.  He saw Dr. Dasie general surgeon who did not recommend cholecystectomy.  Elevated LFTs thought possibly due to drug-induced liver injury from Mounjaro which was discontinued.  09/2024 labs: Previous EBV infection, no current EBV infection.  Immune to hepatitis B.  Not immune to hepatitis A.  No acute hepatitis A, B, or C infection.  Borderline positive ANA with very low titer and he was referred to rheumatology.  Negative alpha 1 antitrypsin, normal ceruloplasmin, negative AMA and ASMA.  CMV negative.  Normal iron.  No hemochromatosis.  12/22/2024 labs showed LFTs returned to normal.  AST 25, ALT 38, alk phos 78, total bilirubin 0.4.  Normal Hgb 15.7, platelets 335.  05/10/2024 abdominal MRI/MRCP: 1. Hepatomegaly. No focal liver lesion or biliary ductal dilatation. 2. Sludge and tiny gallstones. No gallbladder wall thickening, or biliary dilatation. 3. Mild splenomegaly.  History of Present Illness Zachary Herman is a 54 year old male with a history of elevated liver enzymes who presents for  follow-up of liver function and review of recent laboratory results.  Liver Enzyme Abnormalities: - Significant elevation in liver enzymes in May 2025, with values reaching the 1500s - Extensive workup in October 2025 for autoimmune hepatitis, primary biliary cirrhosis, hemochromatosis, alpha-1 antitrypsin deficiency, and Wilson's disease, all negative - ANA mildly positive and one lupus marker slightly elevated, but subsequent testing did not confirm lupus - No evidence of acute viral hepatitis A, B, or C - Epstein-Barr virus and cytomegalovirus testing negative for recent infection - History of gallbladder sludge and tiny gallstones - No severe right upper quadrant pain, nausea, or vomiting during episode of liver enzyme elevation - Liver enzymes currently normal  Recent Viral Illnesses: - Severe viral illness in late March to early April 2025, with intense symptoms followed by rash - Rebound illness about a month later, then another milder episode - No prior similar symptoms - Currently asymptomatic with respect to liver-related issues  Medication Use: - Restarted Mounjaro at 2.5 mg in early summer 2025, currently on 7.5 mg - Previously on higher dose (15 mg) at time of liver enzyme elevation - Also takes Vyvanse  Colorectal Cancer Screening: - Last colonoscopy in June 2018 - Next colonoscopy due in June 2028  GI history: - 05/2017 colonoscopy by Dr. Teressa (for generalized abdominal pain): Normal.  No polyps.  10-year repeat for screening. - 05/2017 EGD by Dr. Teressa: Small hiatal hernia, otherwise normal.  No biopsies.   Current Outpatient Medications  Medication Sig Dispense Refill   amLODipine (NORVASC) 10 MG tablet Take 10  mg by mouth daily.     lisdexamfetamine (VYVANSE) 20 MG capsule Take 20 mg by mouth daily.     MOUNJARO 7.5 MG/0.5ML Pen Inject 7.5 mg into the skin once a week.     viloxazine ER (QELBREE) 200 MG 24 hr capsule Take 600 mg by mouth daily.     AMBULATORY  NON FORMULARY MEDICATION 1 tablet daily. Medication Name: Vitamin A D K (Patient not taking: Reported on 01/07/2025)     Ascorbic Acid (VITAMIN C PO) Take 1 tablet by mouth daily. (Patient not taking: Reported on 01/07/2025)     b complex vitamins tablet Take 1 tablet by mouth daily. (Patient not taking: Reported on 01/07/2025)     co-enzyme Q-10 30 MG capsule Take 30 mg by mouth 3 (three) times daily. (Patient not taking: Reported on 01/07/2025)     KRILL OIL PO Take 1 capsule by mouth daily. (Patient not taking: Reported on 01/07/2025)     MAGNESIUM PO Take 500 mg by mouth daily. (Patient not taking: Reported on 01/07/2025)     Current Facility-Administered Medications  Medication Dose Route Frequency Provider Last Rate Last Admin   0.9 %  sodium chloride  infusion  500 mL Intravenous Continuous Teressa Toribio SQUIBB, MD        Allergies as of 01/07/2025 - Review Complete 01/07/2025  Allergen Reaction Noted   Tetracyclines & related  10/25/2014    Past Medical History:  Diagnosis Date   Adult ADHD    Allergic rhinitis    Esophageal reflux    Gallstones 05/2024   MRCP   High triglycerides    Hyperlipidemia, mild    On fish oil.   Hypertension    IBS (irritable bowel syndrome)    OSA on CPAP    Overweight    Positive ANA (antinuclear antibody)    Sensorineural hearing loss, unilateral, left ear, with unrestricted hearing on the contralateral side    Sleep apnea    APAP   Type 2 diabetes mellitus without complications (HCC)     Past Surgical History:  Procedure Laterality Date   COLONOSCOPY     TYMPANOSTOMY TUBE PLACEMENT      Review of Systems:    All systems reviewed and negative except where noted in HPI.    Physical Exam:  BP 134/78   Pulse (!) 101   Ht 5' 8 (1.727 m)   Wt 222 lb 6 oz (100.9 kg)   BMI 33.81 kg/m  No LMP for male patient.  General: Well-nourished, well-developed in no acute distress.  Neuro: Alert and oriented x 3.  Grossly intact.  Psych: Alert and  cooperative, normal mood and affect.   Imaging Studies: No results found.  Labs: CBC    Component Value Date/Time   WBC 12.1 (H) 12/22/2024 0851   RBC 6.03 (H) 12/22/2024 0851   HGB 15.7 12/22/2024 0851   HCT 47.3 12/22/2024 0851   PLT 335 12/22/2024 0851   MCV 78.4 (L) 12/22/2024 0851   MCH 26.0 (L) 12/22/2024 0851   MCHC 33.2 12/22/2024 0851   RDW 12.7 12/22/2024 0851   LYMPHSABS 2.6 03/06/2017 1610   MONOABS 0.6 03/06/2017 1610   EOSABS 254 12/22/2024 0851   BASOSABS 85 12/22/2024 0851    CMP     Component Value Date/Time   NA 139 12/22/2024 0851   K 4.4 12/22/2024 0851   CL 100 12/22/2024 0851   CO2 32 12/22/2024 0851   GLUCOSE 118 (H) 12/22/2024 0851   BUN  11 12/22/2024 0851   CREATININE 0.82 12/22/2024 0851   CALCIUM 9.8 12/22/2024 0851   PROT 7.0 12/22/2024 0851   ALBUMIN 4.5 09/10/2024 1042   AST 25 12/22/2024 0851   ALT 38 12/22/2024 0851   ALKPHOS 67 09/10/2024 1042   BILITOT 0.4 12/22/2024 0851     Assessment and Plan:   Zachary Herman is a 54 y.o. y/o male returns for follow-up of:  1.  Acutely elevated LFTs, suspect due to Acute Viral Ilness verses drug-induced liver injury (Mounjaro).  Recent liver tests have all returned to normal, and he is currently doing well on Mounjaro 7.5 mg daily.  Currently has no GI symptoms.  GI workup has been negative for hemochromatosis, PBC, autoimmune hepatitis, Wilson's disease, alpha-1 antitrypsin, or acute viral infection.  Liver biopsy is not recommended at this time as his liver tests have returned to normal.  He is immune to hepatitis B.  Nonimmune to hepatitis A. - Gave hepatitis A vaccine #1 today. - Return in 6 months for hepatitis A #2 vaccine. - He will have his labs monitored through his PCP every 6 months. - If liver test become elevated in the future, we are happy to follow-up.  2.  Colon cancer screening is up-to-date -10-year repeat screening colonoscopy will be due 05/2027.   Zachary Console,  PA-C  Follow up as needed if he has any recurrent GI symptoms or elevated LFTs.   "

## 2025-01-07 ENCOUNTER — Ambulatory Visit (INDEPENDENT_AMBULATORY_CARE_PROVIDER_SITE_OTHER): Admitting: Physician Assistant

## 2025-01-07 ENCOUNTER — Encounter: Payer: Self-pay | Admitting: Physician Assistant

## 2025-01-07 VITALS — BP 134/78 | HR 101 | Ht 68.0 in | Wt 222.4 lb

## 2025-01-07 DIAGNOSIS — Z23 Encounter for immunization: Secondary | ICD-10-CM | POA: Diagnosis not present

## 2025-01-07 DIAGNOSIS — R748 Abnormal levels of other serum enzymes: Secondary | ICD-10-CM

## 2025-01-07 DIAGNOSIS — R7989 Other specified abnormal findings of blood chemistry: Secondary | ICD-10-CM

## 2025-01-07 NOTE — Patient Instructions (Addendum)
 Today you have been given your first Hepatitis A vaccine. We are set up for your second Hepatitis A vaccine 07/09/2025.   _______________________________________________________  If your blood pressure at your visit was 140/90 or greater, please contact your primary care physician to follow up on this.  _______________________________________________________  If you are age 56 or older, your body mass index should be between 23-30. Your Body mass index is 33.81 kg/m. If this is out of the aforementioned range listed, please consider follow up with your Primary Care Provider.  If you are age 9 or younger, your body mass index should be between 19-25. Your Body mass index is 33.81 kg/m. If this is out of the aformentioned range listed, please consider follow up with your Primary Care Provider.   ________________________________________________________  The [facility] providers would like to encourage you to use MYCHART to communicate with providers for non-urgent requests or questions.  Due to long hold times on the telephone, sending your provider a message by [facility] may be a faster and more efficient way to get a response.  Please allow 48 business hours for a response.  Please remember that this is for non-urgent requests.  _______________________________________________________  [facility] is using a team-based approach to care.  Your team is made up of your doctor and two to three APPS. Our APPS (Nurse Practitioners and Physician Assistants) work with your physician to ensure care continuity for you. They are fully qualified to address your health concerns and develop a treatment plan. They communicate directly with your gastroenterologist to care for you. Seeing the Advanced Practice Practitioners on your physician's team can help you by facilitating care more promptly, often allowing for earlier appointments, access to diagnostic testing, procedures, and other specialty  referrals.   I appreciate the opportunity to care for you. [name], PA

## 2025-06-30 ENCOUNTER — Ambulatory Visit: Admitting: Physician Assistant

## 2025-07-09 ENCOUNTER — Ambulatory Visit
